# Patient Record
Sex: Male | Born: 1940 | Smoking: Former smoker
Health system: Southern US, Community
[De-identification: ages and names within clinical notes are randomized; demographics above are authoritative.]

## PROBLEM LIST (undated history)

## (undated) DIAGNOSIS — T4145XA Adverse effect of unspecified anesthetic, initial encounter: Secondary | ICD-10-CM

## (undated) DIAGNOSIS — R351 Nocturia: Secondary | ICD-10-CM

## (undated) DIAGNOSIS — N400 Enlarged prostate without lower urinary tract symptoms: Secondary | ICD-10-CM

## (undated) DIAGNOSIS — Z87898 Personal history of other specified conditions: Secondary | ICD-10-CM

## (undated) DIAGNOSIS — Z973 Presence of spectacles and contact lenses: Secondary | ICD-10-CM

## (undated) DIAGNOSIS — I10 Essential (primary) hypertension: Secondary | ICD-10-CM

## (undated) DIAGNOSIS — M1990 Unspecified osteoarthritis, unspecified site: Secondary | ICD-10-CM

## (undated) DIAGNOSIS — T8859XA Other complications of anesthesia, initial encounter: Secondary | ICD-10-CM

## (undated) DIAGNOSIS — R112 Nausea with vomiting, unspecified: Secondary | ICD-10-CM

## (undated) DIAGNOSIS — Z9889 Other specified postprocedural states: Secondary | ICD-10-CM

---

## 2012-05-12 DIAGNOSIS — R6889 Other general symptoms and signs: Secondary | ICD-10-CM | POA: Diagnosis not present

## 2012-08-02 DIAGNOSIS — J069 Acute upper respiratory infection, unspecified: Secondary | ICD-10-CM | POA: Diagnosis not present

## 2012-08-02 DIAGNOSIS — J111 Influenza due to unidentified influenza virus with other respiratory manifestations: Secondary | ICD-10-CM | POA: Diagnosis not present

## 2013-09-27 DIAGNOSIS — Z800 Family history of malignant neoplasm of digestive organs: Secondary | ICD-10-CM | POA: Diagnosis not present

## 2013-09-27 DIAGNOSIS — K5730 Diverticulosis of large intestine without perforation or abscess without bleeding: Secondary | ICD-10-CM | POA: Diagnosis not present

## 2013-09-27 DIAGNOSIS — Z1211 Encounter for screening for malignant neoplasm of colon: Secondary | ICD-10-CM | POA: Diagnosis not present

## 2013-09-27 DIAGNOSIS — K5900 Constipation, unspecified: Secondary | ICD-10-CM | POA: Diagnosis not present

## 2013-10-27 DIAGNOSIS — Z800 Family history of malignant neoplasm of digestive organs: Secondary | ICD-10-CM | POA: Diagnosis not present

## 2013-10-27 DIAGNOSIS — Z86010 Personal history of colonic polyps: Secondary | ICD-10-CM | POA: Diagnosis not present

## 2013-10-27 DIAGNOSIS — D126 Benign neoplasm of colon, unspecified: Secondary | ICD-10-CM | POA: Diagnosis not present

## 2013-10-27 DIAGNOSIS — Z87891 Personal history of nicotine dependence: Secondary | ICD-10-CM | POA: Diagnosis not present

## 2013-10-27 DIAGNOSIS — K5730 Diverticulosis of large intestine without perforation or abscess without bleeding: Secondary | ICD-10-CM | POA: Diagnosis not present

## 2013-10-27 DIAGNOSIS — Z7982 Long term (current) use of aspirin: Secondary | ICD-10-CM | POA: Diagnosis not present

## 2013-10-27 DIAGNOSIS — Z1211 Encounter for screening for malignant neoplasm of colon: Secondary | ICD-10-CM | POA: Diagnosis not present

## 2014-01-04 DIAGNOSIS — R5381 Other malaise: Secondary | ICD-10-CM | POA: Diagnosis not present

## 2014-01-04 DIAGNOSIS — D518 Other vitamin B12 deficiency anemias: Secondary | ICD-10-CM | POA: Diagnosis not present

## 2014-03-28 DIAGNOSIS — G8929 Other chronic pain: Secondary | ICD-10-CM | POA: Diagnosis not present

## 2014-03-28 DIAGNOSIS — M25561 Pain in right knee: Secondary | ICD-10-CM | POA: Diagnosis not present

## 2014-04-02 DIAGNOSIS — M25461 Effusion, right knee: Secondary | ICD-10-CM | POA: Diagnosis not present

## 2014-04-02 DIAGNOSIS — M25561 Pain in right knee: Secondary | ICD-10-CM | POA: Diagnosis not present

## 2014-04-02 DIAGNOSIS — M1711 Unilateral primary osteoarthritis, right knee: Secondary | ICD-10-CM | POA: Diagnosis not present

## 2014-04-05 DIAGNOSIS — Z79899 Other long term (current) drug therapy: Secondary | ICD-10-CM | POA: Diagnosis not present

## 2014-04-05 DIAGNOSIS — M1990 Unspecified osteoarthritis, unspecified site: Secondary | ICD-10-CM | POA: Diagnosis not present

## 2014-04-23 ENCOUNTER — Other Ambulatory Visit: Payer: Self-pay | Admitting: Orthopedic Surgery

## 2014-05-18 HISTORY — PX: CATARACT EXTRACTION W/ INTRAOCULAR LENS  IMPLANT, BILATERAL: SHX1307

## 2014-05-23 ENCOUNTER — Encounter (HOSPITAL_COMMUNITY)
Admission: RE | Admit: 2014-05-23 | Discharge: 2014-05-23 | Disposition: A | Discharge status: Home / Self Care | Payer: Medicare Other | Source: Ambulatory Visit | Attending: Orthopedic Surgery | Admitting: Orthopedic Surgery

## 2014-05-23 ENCOUNTER — Encounter (HOSPITAL_COMMUNITY): Payer: Self-pay

## 2014-05-23 DIAGNOSIS — R918 Other nonspecific abnormal finding of lung field: Secondary | ICD-10-CM | POA: Diagnosis not present

## 2014-05-23 DIAGNOSIS — Z87891 Personal history of nicotine dependence: Secondary | ICD-10-CM | POA: Insufficient documentation

## 2014-05-23 DIAGNOSIS — Z01812 Encounter for preprocedural laboratory examination: Secondary | ICD-10-CM | POA: Diagnosis not present

## 2014-05-23 DIAGNOSIS — Z7982 Long term (current) use of aspirin: Secondary | ICD-10-CM | POA: Diagnosis not present

## 2014-05-23 DIAGNOSIS — Z01818 Encounter for other preprocedural examination: Secondary | ICD-10-CM | POA: Diagnosis not present

## 2014-05-23 DIAGNOSIS — M1711 Unilateral primary osteoarthritis, right knee: Secondary | ICD-10-CM | POA: Insufficient documentation

## 2014-05-23 LAB — COMPREHENSIVE METABOLIC PANEL
ALK PHOS: 77 U/L (ref 39–117)
ALT: 16 U/L (ref 0–53)
AST: 27 U/L (ref 0–37)
Albumin: 4 g/dL (ref 3.5–5.2)
Anion gap: 8 (ref 5–15)
BUN: 14 mg/dL (ref 6–23)
CHLORIDE: 102 meq/L (ref 96–112)
CO2: 29 mmol/L (ref 19–32)
Calcium: 9.3 mg/dL (ref 8.4–10.5)
Creatinine, Ser: 0.96 mg/dL (ref 0.50–1.35)
GFR calc non Af Amer: 79 mL/min — ABNORMAL LOW (ref >90)
Glucose, Bld: 89 mg/dL (ref 70–99)
Potassium: 4.2 mmol/L (ref 3.5–5.1)
Sodium: 139 mmol/L (ref 135–145)
TOTAL PROTEIN: 7.8 g/dL (ref 6.0–8.3)
Total Bilirubin: 0.9 mg/dL (ref 0.3–1.2)

## 2014-05-23 LAB — CBC WITH DIFFERENTIAL/PLATELET
Basophils Absolute: 0 10*3/uL (ref 0.0–0.1)
Basophils Relative: 1 % (ref 0–1)
Eosinophils Absolute: 0.1 10*3/uL (ref 0.0–0.7)
Eosinophils Relative: 2 % (ref 0–5)
HCT: 44 % (ref 39.0–52.0)
HEMOGLOBIN: 14.4 g/dL (ref 13.0–17.0)
LYMPHS PCT: 29 % (ref 12–46)
Lymphs Abs: 1.3 10*3/uL (ref 0.7–4.0)
MCH: 29.2 pg (ref 26.0–34.0)
MCHC: 32.7 g/dL (ref 30.0–36.0)
MCV: 89.2 fL (ref 78.0–100.0)
Monocytes Absolute: 0.6 10*3/uL (ref 0.1–1.0)
Monocytes Relative: 12 % (ref 3–12)
NEUTROS PCT: 56 % (ref 43–77)
Neutro Abs: 2.5 10*3/uL (ref 1.7–7.7)
Platelets: 230 10*3/uL (ref 150–400)
RBC: 4.93 MIL/uL (ref 4.22–5.81)
RDW: 13.5 % (ref 11.5–15.5)
WBC: 4.5 10*3/uL (ref 4.0–10.5)

## 2014-05-23 LAB — PROTIME-INR
INR: 1.02 (ref 0.00–1.49)
Prothrombin Time: 13.5 seconds (ref 11.6–15.2)

## 2014-05-23 LAB — APTT: APTT: 31 s (ref 24–37)

## 2014-05-23 LAB — URINALYSIS, ROUTINE W REFLEX MICROSCOPIC
Bilirubin Urine: NEGATIVE
Glucose, UA: NEGATIVE mg/dL
Hgb urine dipstick: NEGATIVE
KETONES UR: NEGATIVE mg/dL
LEUKOCYTES UA: NEGATIVE
Nitrite: NEGATIVE
PROTEIN: NEGATIVE mg/dL
SPECIFIC GRAVITY, URINE: 1.02 (ref 1.005–1.030)
UROBILINOGEN UA: 1 mg/dL (ref 0.0–1.0)
pH: 7.5 (ref 5.0–8.0)

## 2014-05-23 LAB — SURGICAL PCR SCREEN
MRSA, PCR: NEGATIVE
STAPHYLOCOCCUS AUREUS: NEGATIVE

## 2014-05-23 NOTE — Pre-Procedure Instructions (Signed)
Adam [name]  05/23/2014   Your procedure is scheduled on:  06-04-2014    Report to [facility] Admitting at 9:00 AM  .  Call this number if you have problems the morning of surgery: [phone]   Remember:   Do not eat food or drink liquids after midnight.   Take these medicines the morning of surgery with A SIP OF WATER: none   Do not wear jewelry.  Do not wear lotions, powders, or perfumes. You may not wear deodorant.  Do not shave 48 hours prior to surgery. Men may shave face and neck.  Do not bring valuables to the hospital.  [facility] is not responsible  for any belongings or valuables.               Contacts, dentures or bridgework may not be worn into surgery.   Leave suitcase in the car. After surgery it may be brought to your room.  For patients admitted to the hospital, discharge time is determined by your  treatment team.               Patients discharged the day of surgery will not be allowed to drive  home.    Special Instructions: See attached sheet for instructions on CHG bath/shower   Please read over the following fact sheets that you were given: Pain Booklet, Coughing and Deep Breathing and Surgical Site Infection Prevention

## 2014-05-24 LAB — URINE CULTURE
Colony Count: NO GROWTH
Culture: NO GROWTH

## 2014-05-25 DIAGNOSIS — M1711 Unilateral primary osteoarthritis, right knee: Secondary | ICD-10-CM | POA: Diagnosis not present

## 2014-06-03 MED ORDER — CHLORHEXIDINE GLUCONATE 4 % EX LIQD
60.0000 mL | Freq: Once | CUTANEOUS | Status: DC
  Filled 2014-06-03: qty 60

## 2014-06-03 MED ORDER — BUPIVACAINE LIPOSOME 1.3 % IJ SUSP
20.0000 mL | Freq: Once | INTRAMUSCULAR | Status: DC
  Filled 2014-06-03: qty 20

## 2014-06-03 MED ORDER — CEFAZOLIN SODIUM-DEXTROSE 2-3 GM-% IV SOLR
2.0000 g | INTRAVENOUS | Status: DC
Start: 2014-06-03 — End: 2014-06-04

## 2014-06-03 MED ORDER — TRANEXAMIC ACID 100 MG/ML IV SOLN
1000.0000 mg | INTRAVENOUS | Status: DC
  Filled 2014-06-03: qty 10

## 2014-06-03 NOTE — Anesthesia Preprocedure Evaluation (Signed)
Anesthesia Evaluation  Patient identified by MRN, date of birth, ID band Patient awake    Reviewed: Allergy & Precautions, NPO status , Patient's Chart, lab work & pertinent test results, reviewed documented beta blocker date and time   Airway        Dental   Pulmonary former smoker,          Cardiovascular negative cardio ROS      Neuro/Psych negative neurological ROS     GI/Hepatic negative GI ROS, Neg liver ROS,   Endo/Other  negative endocrine ROS  Renal/GU negative Renal ROS     Musculoskeletal   Abdominal   Peds  Hematology negative hematology ROS (+)   Anesthesia Other Findings   Reproductive/Obstetrics                             Anesthesia Physical Anesthesia Plan  ASA: II  Anesthesia Plan: General   Post-op Pain Management: MAC Combined w/ Regional for Post-op pain   Induction: Intravenous  Airway Management Planned: LMA  Additional Equipment:   Intra-op Plan:   Post-operative Plan: Extubation in OR  Informed Consent: I have reviewed the patients History and Physical, chart, labs and discussed the procedure including the risks, benefits and alternatives for the proposed anesthesia with the patient or authorized representative who has indicated his/her understanding and acceptance.     Plan Discussed with:   Anesthesia Plan Comments: (Check am EKG)        Anesthesia Quick Evaluation

## 2014-06-04 ENCOUNTER — Encounter (HOSPITAL_COMMUNITY): Payer: Self-pay | Admitting: Surgery

## 2014-06-04 ENCOUNTER — Inpatient Hospital Stay (HOSPITAL_COMMUNITY): Payer: Medicare Other | Admitting: Anesthesiology

## 2014-06-04 ENCOUNTER — Inpatient Hospital Stay (HOSPITAL_COMMUNITY)
Admission: RE | Admit: 2014-06-04 | Discharge: 2014-06-07 | DRG: 470 | Disposition: A | Discharge status: Home / Self Care | Payer: Medicare Other | Source: Ambulatory Visit | Attending: Orthopedic Surgery | Admitting: Orthopedic Surgery

## 2014-06-04 ENCOUNTER — Encounter (HOSPITAL_COMMUNITY)
Admission: RE | Disposition: A | Discharge status: Home / Self Care | Payer: Self-pay | Source: Ambulatory Visit | Attending: Orthopedic Surgery

## 2014-06-04 DIAGNOSIS — M25561 Pain in right knee: Secondary | ICD-10-CM | POA: Diagnosis not present

## 2014-06-04 DIAGNOSIS — G8918 Other acute postprocedural pain: Secondary | ICD-10-CM | POA: Diagnosis not present

## 2014-06-04 DIAGNOSIS — D62 Acute posthemorrhagic anemia: Secondary | ICD-10-CM | POA: Diagnosis not present

## 2014-06-04 DIAGNOSIS — Z87891 Personal history of nicotine dependence: Secondary | ICD-10-CM

## 2014-06-04 DIAGNOSIS — M1711 Unilateral primary osteoarthritis, right knee: Principal | ICD-10-CM | POA: Diagnosis present

## 2014-06-04 DIAGNOSIS — Z96659 Presence of unspecified artificial knee joint: Secondary | ICD-10-CM

## 2014-06-04 DIAGNOSIS — M179 Osteoarthritis of knee, unspecified: Secondary | ICD-10-CM | POA: Diagnosis not present

## 2014-06-04 HISTORY — DX: Other complications of anesthesia, initial encounter: T88.59XA

## 2014-06-04 HISTORY — DX: Nausea with vomiting, unspecified: R11.2

## 2014-06-04 HISTORY — DX: Other specified postprocedural states: Z98.890

## 2014-06-04 HISTORY — PX: TOTAL KNEE ARTHROPLASTY: SHX125

## 2014-06-04 HISTORY — DX: Adverse effect of unspecified anesthetic, initial encounter: T41.45XA

## 2014-06-04 LAB — CBC
HCT: 37.8 % — ABNORMAL LOW (ref 39.0–52.0)
HEMOGLOBIN: 12.5 g/dL — AB (ref 13.0–17.0)
MCH: 29.3 pg (ref 26.0–34.0)
MCHC: 33.1 g/dL (ref 30.0–36.0)
MCV: 88.7 fL (ref 78.0–100.0)
Platelets: 211 10*3/uL (ref 150–400)
RBC: 4.26 MIL/uL (ref 4.22–5.81)
RDW: 13.5 % (ref 11.5–15.5)
WBC: 7.2 10*3/uL (ref 4.0–10.5)

## 2014-06-04 LAB — CREATININE, SERUM
Creatinine, Ser: 0.92 mg/dL (ref 0.50–1.35)
GFR, EST NON AFRICAN AMERICAN: 80 mL/min — AB (ref >90)

## 2014-06-04 SURGERY — ARTHROPLASTY, KNEE, TOTAL
Anesthesia: General | Laterality: Right

## 2014-06-04 MED ORDER — CELECOXIB 200 MG PO CAPS
200.0000 mg | ORAL_CAPSULE | Freq: Two times a day (BID) | ORAL | Status: DC
  Administered 2014-06-04 – 2014-06-07 (×6): 200 mg via ORAL
  Filled 2014-06-04 (×8): qty 1

## 2014-06-04 MED ORDER — FENTANYL CITRATE 0.05 MG/ML IJ SOLN
50.0000 ug | Freq: Once | INTRAMUSCULAR | Status: AC
  Administered 2014-06-04: 50 ug via INTRAVENOUS

## 2014-06-04 MED ORDER — GLYCOPYRROLATE 0.2 MG/ML IJ SOLN
INTRAMUSCULAR | Status: AC
  Filled 2014-06-04: qty 2

## 2014-06-04 MED ORDER — SUCCINYLCHOLINE CHLORIDE 20 MG/ML IJ SOLN
INTRAMUSCULAR | Status: AC
  Filled 2014-06-04: qty 1

## 2014-06-04 MED ORDER — NEOSTIGMINE METHYLSULFATE 10 MG/10ML IV SOLN
INTRAVENOUS | Status: DC | PRN
  Administered 2014-06-04: 3 mg via INTRAVENOUS

## 2014-06-04 MED ORDER — ZOLPIDEM TARTRATE 5 MG PO TABS: 5.0000 mg | ORAL_TABLET | Freq: Every evening | ORAL | Status: DC | PRN

## 2014-06-04 MED ORDER — LACTATED RINGERS IV SOLN
INTRAVENOUS | Status: DC
  Administered 2014-06-04 (×2): via INTRAVENOUS

## 2014-06-04 MED ORDER — PHENOL 1.4 % MT LIQD: 1.0000 | OROMUCOSAL | Status: DC | PRN

## 2014-06-04 MED ORDER — ONDANSETRON HCL 4 MG/2ML IJ SOLN
INTRAMUSCULAR | Status: AC
  Filled 2014-06-04: qty 2

## 2014-06-04 MED ORDER — FENTANYL CITRATE 0.05 MG/ML IJ SOLN
INTRAMUSCULAR | Status: AC
  Filled 2014-06-04: qty 5

## 2014-06-04 MED ORDER — SENNOSIDES-DOCUSATE SODIUM 8.6-50 MG PO TABS: 1.0000 | ORAL_TABLET | Freq: Every evening | ORAL | Status: DC | PRN

## 2014-06-04 MED ORDER — OXYCODONE HCL ER 10 MG PO T12A
10.0000 mg | EXTENDED_RELEASE_TABLET | Freq: Two times a day (BID) | ORAL | Status: DC
  Administered 2014-06-04 – 2014-06-07 (×6): 10 mg via ORAL
  Filled 2014-06-04 (×6): qty 1

## 2014-06-04 MED ORDER — ONDANSETRON HCL 4 MG/2ML IJ SOLN
4.0000 mg | Freq: Four times a day (QID) | INTRAMUSCULAR | Status: DC | PRN
  Administered 2014-06-04: 4 mg via INTRAVENOUS
  Filled 2014-06-04: qty 2

## 2014-06-04 MED ORDER — METOCLOPRAMIDE HCL 10 MG PO TABS
5.0000 mg | ORAL_TABLET | Freq: Three times a day (TID) | ORAL | Status: DC | PRN
Start: 2014-06-04 — End: 2014-06-07

## 2014-06-04 MED ORDER — ONDANSETRON HCL 4 MG PO TABS
4.0000 mg | ORAL_TABLET | Freq: Four times a day (QID) | ORAL | Status: DC | PRN
  Administered 2014-06-05 (×2): 4 mg via ORAL
  Filled 2014-06-04 (×2): qty 1

## 2014-06-04 MED ORDER — METHOCARBAMOL 1000 MG/10ML IJ SOLN: 500.0000 mg | Freq: Four times a day (QID) | INTRAVENOUS | Status: DC | PRN

## 2014-06-04 MED ORDER — PROPOFOL 10 MG/ML IV BOLUS
INTRAVENOUS | Status: DC | PRN
  Administered 2014-06-04: 200 mg via INTRAVENOUS

## 2014-06-04 MED ORDER — METHOCARBAMOL 500 MG PO TABS
500.0000 mg | ORAL_TABLET | Freq: Four times a day (QID) | ORAL | Status: DC | PRN
Start: 2014-06-04 — End: 2014-06-07
  Administered 2014-06-05 – 2014-06-07 (×3): 500 mg via ORAL
  Filled 2014-06-04 (×3): qty 1

## 2014-06-04 MED ORDER — ROCURONIUM BROMIDE 100 MG/10ML IV SOLN
INTRAVENOUS | Status: DC | PRN
  Administered 2014-06-04: 40 mg via INTRAVENOUS

## 2014-06-04 MED ORDER — GLYCOPYRROLATE 0.2 MG/ML IJ SOLN
INTRAMUSCULAR | Status: DC | PRN
  Administered 2014-06-04: 0.4 mg via INTRAVENOUS

## 2014-06-04 MED ORDER — LACTATED RINGERS IV SOLN
INTRAVENOUS | Status: DC | PRN
  Administered 2014-06-04 (×2): via INTRAVENOUS

## 2014-06-04 MED ORDER — ALUM & MAG HYDROXIDE-SIMETH 200-200-20 MG/5ML PO SUSP: 30.0000 mL | ORAL | Status: DC | PRN

## 2014-06-04 MED ORDER — FENTANYL CITRATE 0.05 MG/ML IJ SOLN: 25.0000 ug | INTRAMUSCULAR | Status: DC | PRN

## 2014-06-04 MED ORDER — FENTANYL CITRATE 0.05 MG/ML IJ SOLN
INTRAMUSCULAR | Status: AC
  Filled 2014-06-04: qty 2

## 2014-06-04 MED ORDER — BISACODYL 5 MG PO TBEC: 5.0000 mg | DELAYED_RELEASE_TABLET | Freq: Every day | ORAL | Status: DC | PRN

## 2014-06-04 MED ORDER — MEPERIDINE HCL 25 MG/ML IJ SOLN: 6.2500 mg | INTRAMUSCULAR | Status: DC | PRN

## 2014-06-04 MED ORDER — FLEET ENEMA 7-19 GM/118ML RE ENEM: 1.0000 | ENEMA | Freq: Once | RECTAL | Status: AC | PRN

## 2014-06-04 MED ORDER — LIDOCAINE HCL (CARDIAC) 20 MG/ML IV SOLN
INTRAVENOUS | Status: AC
  Filled 2014-06-04: qty 5

## 2014-06-04 MED ORDER — MENTHOL 3 MG MT LOZG: 1.0000 | LOZENGE | OROMUCOSAL | Status: DC | PRN

## 2014-06-04 MED ORDER — ACETAMINOPHEN 325 MG PO TABS: 650.0000 mg | ORAL_TABLET | Freq: Four times a day (QID) | ORAL | Status: DC | PRN

## 2014-06-04 MED ORDER — SODIUM CHLORIDE 0.9 % IV SOLN: INTRAVENOUS | Status: DC

## 2014-06-04 MED ORDER — PROMETHAZINE HCL 25 MG/ML IJ SOLN: 6.2500 mg | INTRAMUSCULAR | Status: DC | PRN

## 2014-06-04 MED ORDER — CEFAZOLIN SODIUM-DEXTROSE 2-3 GM-% IV SOLR
2.0000 g | Freq: Four times a day (QID) | INTRAVENOUS | Status: AC
  Administered 2014-06-04 (×2): 2 g via INTRAVENOUS
  Filled 2014-06-04 (×3): qty 50

## 2014-06-04 MED ORDER — ACETAMINOPHEN 650 MG RE SUPP: 650.0000 mg | Freq: Four times a day (QID) | RECTAL | Status: DC | PRN

## 2014-06-04 MED ORDER — BUPIVACAINE-EPINEPHRINE (PF) 0.5% -1:200000 IJ SOLN
INTRAMUSCULAR | Status: DC | PRN
  Administered 2014-06-04: 30 mL via PERINEURAL

## 2014-06-04 MED ORDER — MIDAZOLAM HCL 2 MG/2ML IJ SOLN
INTRAMUSCULAR | Status: AC
  Administered 2014-06-04: 1 mg
  Filled 2014-06-04: qty 2

## 2014-06-04 MED ORDER — MIDAZOLAM HCL 5 MG/ML IJ SOLN: 1.0000 mg | Freq: Once | INTRAMUSCULAR | Status: DC

## 2014-06-04 MED ORDER — PHENYLEPHRINE HCL 10 MG/ML IJ SOLN
INTRAMUSCULAR | Status: DC | PRN
  Administered 2014-06-04 (×2): 120 ug via INTRAVENOUS
  Administered 2014-06-04 (×2): 80 ug via INTRAVENOUS

## 2014-06-04 MED ORDER — NEOSTIGMINE METHYLSULFATE 10 MG/10ML IV SOLN
INTRAVENOUS | Status: AC
  Filled 2014-06-04: qty 1

## 2014-06-04 MED ORDER — OXYCODONE HCL 5 MG PO TABS
5.0000 mg | ORAL_TABLET | ORAL | Status: DC | PRN
  Administered 2014-06-04 – 2014-06-07 (×14): 10 mg via ORAL
  Filled 2014-06-04 (×14): qty 2

## 2014-06-04 MED ORDER — SODIUM CHLORIDE 0.9 % IV SOLN
INTRAVENOUS | Status: DC
  Administered 2014-06-04 – 2014-06-05 (×2): via INTRAVENOUS

## 2014-06-04 MED ORDER — BUPIVACAINE LIPOSOME 1.3 % IJ SUSP
INTRAMUSCULAR | Status: DC | PRN
  Administered 2014-06-04: 20 mL

## 2014-06-04 MED ORDER — HYDROMORPHONE HCL 1 MG/ML IJ SOLN
1.0000 mg | INTRAMUSCULAR | Status: DC | PRN
  Administered 2014-06-04: 1 mg via INTRAVENOUS
  Filled 2014-06-04: qty 1

## 2014-06-04 MED ORDER — BUPIVACAINE-EPINEPHRINE (PF) 0.5% -1:200000 IJ SOLN
INTRAMUSCULAR | Status: AC
  Filled 2014-06-04: qty 30

## 2014-06-04 MED ORDER — ENOXAPARIN SODIUM 30 MG/0.3ML ~~LOC~~ SOLN
30.0000 mg | Freq: Two times a day (BID) | SUBCUTANEOUS | Status: DC
  Administered 2014-06-05 – 2014-06-07 (×5): 30 mg via SUBCUTANEOUS
  Filled 2014-06-04 (×7): qty 0.3

## 2014-06-04 MED ORDER — CEFAZOLIN SODIUM-DEXTROSE 2-3 GM-% IV SOLR
INTRAVENOUS | Status: AC
  Administered 2014-06-04: 2 g via INTRAVENOUS
  Filled 2014-06-04: qty 50

## 2014-06-04 MED ORDER — DIPHENHYDRAMINE HCL 12.5 MG/5ML PO ELIX: 12.5000 mg | ORAL_SOLUTION | ORAL | Status: DC | PRN

## 2014-06-04 MED ORDER — TRANEXAMIC ACID 100 MG/ML IV SOLN
1000.0000 mg | INTRAVENOUS | Status: DC | PRN
  Administered 2014-06-04: 1000 mg via INTRAVENOUS

## 2014-06-04 MED ORDER — BUPIVACAINE-EPINEPHRINE 0.5% -1:200000 IJ SOLN
INTRAMUSCULAR | Status: DC | PRN
  Administered 2014-06-04: 30 mL

## 2014-06-04 MED ORDER — FENTANYL CITRATE 0.05 MG/ML IJ SOLN
INTRAMUSCULAR | Status: DC | PRN
Start: 2014-06-04 — End: 2014-06-04
  Administered 2014-06-04 (×2): 50 ug via INTRAVENOUS
  Administered 2014-06-04: 100 ug via INTRAVENOUS
  Administered 2014-06-04 (×5): 50 ug via INTRAVENOUS

## 2014-06-04 MED ORDER — ONDANSETRON HCL 4 MG/2ML IJ SOLN
INTRAMUSCULAR | Status: DC | PRN
  Administered 2014-06-04: 4 mg via INTRAVENOUS

## 2014-06-04 MED ORDER — METOCLOPRAMIDE HCL 5 MG/ML IJ SOLN: 5.0000 mg | Freq: Three times a day (TID) | INTRAMUSCULAR | Status: DC | PRN

## 2014-06-04 MED ORDER — DOCUSATE SODIUM 100 MG PO CAPS
100.0000 mg | ORAL_CAPSULE | Freq: Two times a day (BID) | ORAL | Status: DC
  Administered 2014-06-05 – 2014-06-07 (×5): 100 mg via ORAL
  Filled 2014-06-04 (×6): qty 1

## 2014-06-04 MED ORDER — EPHEDRINE SULFATE 50 MG/ML IJ SOLN
INTRAMUSCULAR | Status: DC | PRN
  Administered 2014-06-04: 15 mg via INTRAVENOUS
  Administered 2014-06-04: 10 mg via INTRAVENOUS

## 2014-06-04 SURGICAL SUPPLY — 60 items
BANDAGE ESMARK 6X9 LF (GAUZE/BANDAGES/DRESSINGS) ×1 IMPLANT
BLADE SAGITTAL 13X1.27X60 (BLADE) ×2 IMPLANT
BLADE SAGITTAL 13X1.27X60MM (BLADE) ×1
BLADE SAW SGTL 83.5X18.5 (BLADE) ×3 IMPLANT
BLADE SURG 10 STRL SS (BLADE) ×3 IMPLANT
BNDG ESMARK 6X9 LF (GAUZE/BANDAGES/DRESSINGS) ×3
BOWL SMART MIX CTS (DISPOSABLE) ×3 IMPLANT
CAPT KNEE TOTAL 3 ×3 IMPLANT
CEMENT BONE SIMPLEX SPEEDSET (Cement) ×6 IMPLANT
COVER SURGICAL LIGHT HANDLE (MISCELLANEOUS) ×3 IMPLANT
CUFF TOURNIQUET SINGLE 34IN LL (TOURNIQUET CUFF) ×3 IMPLANT
DRAPE EXTREMITY T 121X128X90 (DRAPE) ×3 IMPLANT
DRAPE IMP U-DRAPE 54X76 (DRAPES) ×3 IMPLANT
DRAPE INCISE IOBAN 66X45 STRL (DRAPES) ×6 IMPLANT
DRAPE PROXIMA HALF (DRAPES) ×3 IMPLANT
DRAPE U-SHAPE 47X51 STRL (DRAPES) ×3 IMPLANT
DRSG ADAPTIC 3X8 NADH LF (GAUZE/BANDAGES/DRESSINGS) ×3 IMPLANT
DRSG PAD ABDOMINAL 8X10 ST (GAUZE/BANDAGES/DRESSINGS) ×3 IMPLANT
DURAPREP 26ML APPLICATOR (WOUND CARE) ×6 IMPLANT
ELECT REM PT RETURN 9FT ADLT (ELECTROSURGICAL) ×3
ELECTRODE REM PT RTRN 9FT ADLT (ELECTROSURGICAL) ×1 IMPLANT
EVACUATOR 1/8 PVC DRAIN (DRAIN) ×3 IMPLANT
GAUZE SPONGE 4X4 12PLY STRL (GAUZE/BANDAGES/DRESSINGS) ×3 IMPLANT
GLOVE BIOGEL M 7.0 STRL (GLOVE) IMPLANT
GLOVE BIOGEL PI IND STRL 7.5 (GLOVE) IMPLANT
GLOVE BIOGEL PI IND STRL 8.5 (GLOVE) ×2 IMPLANT
GLOVE BIOGEL PI INDICATOR 7.5 (GLOVE)
GLOVE BIOGEL PI INDICATOR 8.5 (GLOVE) ×4
GLOVE SURG ORTHO 8.0 STRL STRW (GLOVE) ×6 IMPLANT
GOWN STRL REUS W/ TWL LRG LVL3 (GOWN DISPOSABLE) ×1 IMPLANT
GOWN STRL REUS W/ TWL XL LVL3 (GOWN DISPOSABLE) ×2 IMPLANT
GOWN STRL REUS W/TWL LRG LVL3 (GOWN DISPOSABLE) ×2
GOWN STRL REUS W/TWL XL LVL3 (GOWN DISPOSABLE) ×4
HANDPIECE INTERPULSE COAX TIP (DISPOSABLE) ×2
HOOD PEEL AWAY FACE SHEILD DIS (HOOD) ×9 IMPLANT
KIT BASIN OR (CUSTOM PROCEDURE TRAY) ×3 IMPLANT
KIT ROOM TURNOVER OR (KITS) ×3 IMPLANT
KNEE CAPITATED TOTAL 3 ×1 IMPLANT
MANIFOLD NEPTUNE II (INSTRUMENTS) ×3 IMPLANT
NEEDLE 22X1 1/2 (OR ONLY) (NEEDLE) ×6 IMPLANT
NS IRRIG 1000ML POUR BTL (IV SOLUTION) ×3 IMPLANT
PACK TOTAL JOINT (CUSTOM PROCEDURE TRAY) ×3 IMPLANT
PACK UNIVERSAL I (CUSTOM PROCEDURE TRAY) ×3 IMPLANT
PAD ABD 8X10 STRL (GAUZE/BANDAGES/DRESSINGS) ×3 IMPLANT
PAD ARMBOARD 7.5X6 YLW CONV (MISCELLANEOUS) ×6 IMPLANT
PADDING CAST COTTON 6X4 STRL (CAST SUPPLIES) ×3 IMPLANT
SET HNDPC FAN SPRY TIP SCT (DISPOSABLE) ×1 IMPLANT
SPONGE GAUZE 4X4 12PLY STER LF (GAUZE/BANDAGES/DRESSINGS) ×3 IMPLANT
STAPLER VISISTAT 35W (STAPLE) ×3 IMPLANT
SUCTION FRAZIER TIP 10 FR DISP (SUCTIONS) ×3 IMPLANT
SUT BONE WAX W31G (SUTURE) ×3 IMPLANT
SUT VIC AB 0 CTB1 27 (SUTURE) ×6 IMPLANT
SUT VIC AB 1 CT1 27 (SUTURE) ×4
SUT VIC AB 1 CT1 27XBRD ANBCTR (SUTURE) ×2 IMPLANT
SUT VIC AB 2-0 CT1 27 (SUTURE) ×4
SUT VIC AB 2-0 CT1 TAPERPNT 27 (SUTURE) ×2 IMPLANT
SYR 20CC LL (SYRINGE) ×6 IMPLANT
TOWEL OR 17X24 6PK STRL BLUE (TOWEL DISPOSABLE) ×3 IMPLANT
TOWEL OR 17X26 10 PK STRL BLUE (TOWEL DISPOSABLE) ×3 IMPLANT
WATER STERILE IRR 1000ML POUR (IV SOLUTION) ×6 IMPLANT

## 2014-06-04 NOTE — Progress Notes (Signed)
Physical Therapy Evaluation Patient Details Name: Adam [name] MRN: [id number] DOB: 01-28-1941 Today's Date: 06/04/2014   History of Present Illness  Patient is a 74 yo male admitted 06/04/14 now s/p Rt TKA.  PMH:  Arthritis  Clinical Impression  Patient presents with problems listed below.  Will benefit from acute PT to maximize independence prior to discharge home with wife.  Limited today due to lightheadedness.  Should progress well with PT.    Follow Up Recommendations Home health PT;Supervision/Assistance - 24 hour    Equipment Recommendations  None recommended by PT    Recommendations for Other Services       Precautions / Restrictions Precautions Precautions: Knee Precaution Booklet Issued: Yes (comment) Precaution Comments: Reviewed precautions with patient and wife. Restrictions Weight Bearing Restrictions: Yes RLE Weight Bearing: Weight bearing as tolerated      Mobility  Bed Mobility Overal bed mobility: Needs Assistance Bed Mobility: Supine to Sit     Supine to sit: Min guard     General bed mobility comments: Removed CPM from RLE.  Verbal cues for technique.  Patient able to move RLE off of bed and use UE's to move to sitting position.  Once sitting, patient with good balance.  Transfers Overall transfer level: Needs assistance Equipment used: Rolling walker (2 wheeled) Transfers: Sit to/from [facility] Sit to Stand: Min assist Stand pivot transfers: Min assist       General transfer comment: Verbal cues for hand placement and technique.  Assist to rise to standing and for balance.  Patient able to take several steps to pivot to chair. Patient became lightheaded.  Sat in chair.  Improved in 2-3 minutes.  Ambulation/Gait                Stairs            Wheelchair Mobility    Modified Rankin (Stroke Patients Only)       Balance                                             Pertinent  Vitals/Pain Pain Assessment: 0-10 Pain Score: 3  Pain Location: Rt knee Pain Descriptors / Indicators: Aching;Sore Pain Intervention(s): Repositioned;Premedicated before session    [place] expects to be discharged to:: Private residence Living Arrangements: Spouse/significant other Available Help at Discharge: Family;Available 24 hours/day Type of Home: House Home Access: Ramped entrance     Home Layout: One level Home Equipment: Walker - 2 wheels;Bedside commode      Prior Function Level of Independence: Independent               Hand Dominance        Extremity/Trunk Assessment   Upper Extremity Assessment: Overall WFL for tasks assessed           Lower Extremity Assessment: RLE deficits/detail RLE Deficits / Details: Decreased strength and ROM post-op.  Patient able to assist with moving RLE off of bed.    Cervical / Trunk Assessment: Normal  Communication   Communication: No difficulties  Cognition Arousal/Alertness: Awake/alert Behavior During Therapy: WFL for tasks assessed/performed Overall Cognitive Status: Within Functional Limits for tasks assessed                      General Comments      Exercises Total Joint Exercises Ankle Circles/Pumps: AROM;Both;10  reps;Seated      Assessment/Plan    PT Assessment Patient needs continued PT services  PT Diagnosis Difficulty walking;Acute pain   PT Problem List Decreased strength;Decreased range of motion;Decreased activity tolerance;Decreased balance;Decreased mobility;Decreased knowledge of use of DME;Decreased knowledge of precautions;Pain  PT Treatment Interventions DME instruction;Gait training;Functional mobility training;Therapeutic activities;Therapeutic exercise;Patient/family education   PT Goals (Current goals can be found in the Care Plan section) Acute Rehab PT Goals Patient Stated Goal: To go home tomorrow PT Goal Formulation: With patient/family Time For  Goal Achievement: 06/11/14 Potential to Achieve Goals: Good    Frequency 7X/week   Barriers to discharge        Co-evaluation               End of Session Equipment Utilized During Treatment: Gait belt;Oxygen Activity Tolerance: Patient limited by pain (Limited by lightheadedness) Patient left: in chair;with call bell/phone within reach;with family/visitor present Nurse Communication: Mobility status         Time: 4827-0786 PT Time Calculation (min) (ACUTE ONLY): 22 min   Charges:   PT Evaluation $Initial PT Evaluation Tier I: 1 Procedure PT Treatments $Therapeutic Activity: 8-22 mins   PT G Codes:        Despina Pole 06/16/2014, 7:18 PM Carita Pian. Sanjuana Kava, [place] Pager [phone]

## 2014-06-04 NOTE — Progress Notes (Signed)
Utilization review completed.  

## 2014-06-04 NOTE — Progress Notes (Signed)
Orthopedic Tech Progress Note Patient Details:  Adam [name] Aug 13, 1940 [id number]  CPM Right Knee CPM Right Knee: On Right Knee Flexion (Degrees): 90 Right Knee Extension (Degrees): 0 Additional Comments: trapeze bar patient helper Viewed order from doctor's order list  Hildred Priest 06/04/2014, 2:24 PM

## 2014-06-04 NOTE — Transfer of Care (Signed)
Immediate Anesthesia Transfer of Care Note  Patient: Adam [name]  Procedure(s) Performed: Procedure(s): TOTAL KNEE ARTHROPLASTY (Right)  Patient Location: PACU  Anesthesia Type:GA combined with regional for post-op pain  Level of Consciousness: awake, alert , oriented and patient cooperative  Airway & Oxygen Therapy: Patient Spontanous Breathing and Patient connected to nasal cannula oxygen  Post-op Assessment: Report given to PACU RN and Post -op Vital signs reviewed and stable  Post vital signs: Reviewed and stable  Complications: No apparent anesthesia complications

## 2014-06-04 NOTE — Anesthesia Postprocedure Evaluation (Signed)
  Anesthesia Post-op Note  Patient: Adam [name]  Procedure(s) Performed: Procedure(s): TOTAL KNEE ARTHROPLASTY (Right)  Patient Location: PACU  Anesthesia Type:General  Level of Consciousness: awake, alert  and oriented  Airway and Oxygen Therapy: Patient Spontanous Breathing  Post-op Pain: moderate  Post-op Assessment: Post-op Vital signs reviewed and Patient's Cardiovascular Status Stable  Post-op Vital Signs: Reviewed and stable  Last Vitals:  Filed Vitals:   06/04/14 1447  BP: 149/86  Pulse: 76  Temp: 36.3 C  Resp: 18    Complications: No apparent anesthesia complications

## 2014-06-04 NOTE — Progress Notes (Signed)
Orthopedic Tech Progress Note Patient Details:  AUGUSTA HILBERT 1940-06-06 [id number] On cpm at 7:30 pm  Patient ID: SAVA PROBY, male   DOB: Oct 06, 1940, 74 y.o.   MRN: [id number]   Braulio Bosch 06/04/2014, 7:32 PM

## 2014-06-04 NOTE — Anesthesia Procedure Notes (Addendum)
Anesthesia Regional Block:  Adductor canal block  Pre-Anesthetic Checklist: ,, timeout performed, Correct Patient, Correct Site, Correct Laterality, Correct Procedure, Correct Position, site marked, Risks and benefits discussed,  Surgical consent,  Pre-op evaluation,  At surgeon's request and post-op pain management  Laterality: Lower and Right  Prep: Maximum Sterile Barrier Precautions used and chloraprep       Needles:  Injection technique: Single-shot     Needle Length: 10cm 10 cm Needle Gauge: 21 and 21 G    Additional Needles:  Procedures: ultrasound guided (picture in chart) Adductor canal block Narrative:  Start time: 06/04/2014 10:34 AM End time: 06/04/2014 10:45 AM  Performed by: Personally  Anesthesiologist: Alexis Frock  Additional Notes: R AC block, 44ml .5% marcaine with epi, US guided, talked to patient thorough procedure, multiple asp, no complications   Procedure Name: Intubation Date/Time: 06/04/2014 11:29 AM Performed by: Julian Reil Pre-anesthesia Checklist: Patient identified, Emergency Drugs available, Suction available and Patient being monitored Patient Re-evaluated:Patient Re-evaluated prior to inductionOxygen Delivery Method: Circle system utilized Preoxygenation: Pre-oxygenation with 100% oxygen Intubation Type: IV induction Ventilation: Mask ventilation without difficulty Laryngoscope Size: Mac and 4 Grade View: Grade I Tube type: Oral Tube size: 7.5 mm Number of attempts: 1 Airway Equipment and Method: Stylet Placement Confirmation: ETT inserted through vocal cords under direct vision,  positive ETCO2 and breath sounds checked- equal and bilateral Secured at: 22 cm Tube secured with: Tape Dental Injury: Teeth and Oropharynx as per pre-operative assessment  Comments: Attempted to place #5 LMA x 2 attempts.  Could not obtain seal.  Resumed bag/mask ventilation without difficulty. DL x 1 with MAC 4. Atraumatic oral intubation.

## 2014-06-04 NOTE — Op Note (Signed)
TOTAL KNEE REPLACEMENT OPERATIVE NOTE:  06/04/2014  8:08 PM  PATIENT:  Adam [name]  74 y.o. male  PRE-OPERATIVE DIAGNOSIS:  primary osteoarthritis right knee  POST-OPERATIVE DIAGNOSIS:  primary osteoarthritis right knee  PROCEDURE:  Procedure(s): TOTAL KNEE ARTHROPLASTY  SURGEON:  Surgeon(s): Vickey Huger, MD  PHYSICIAN ASSISTANT: Carlynn Spry, [facility]  ANESTHESIA:   general  DRAINS: Hemovac  SPECIMEN: None  COUNTS:  Correct  TOURNIQUET:   Total Tourniquet Time Documented: Thigh (Right) - 45 minutes Total: Thigh (Right) - 45 minutes   DICTATION:  Indication for procedure:    The patient is a 74 y.o. male who has failed conservative treatment for primary osteoarthritis right knee.  Informed consent was obtained prior to anesthesia. The risks versus benefits of the operation were explain and in a way the patient can, and did, understand.   On the implant demand matching protocol, this patient scored 10.  Therefore, this patient was not receive a polyethylene insert with vitamin E which is a high demand implant.  Description of procedure:     The patient was taken to the operating room and placed under anesthesia.  The patient was positioned in the usual fashion taking care that all body parts were adequately padded and/or protected.  I foley catheter was not placed.  A tourniquet was applied and the leg prepped and draped in the usual sterile fashion.  The extremity was exsanguinated with the esmarch and tourniquet inflated to 350 mmHg.  Pre-operative range of motion was normal.  The knee was in 6 degree of significant varus.  A midline incision approximately 6-7 inches long was made with a #10 blade.  A new blade was used to make a parapatellar arthrotomy going 2-3 cm into the quadriceps tendon, over the patella, and alongside the medial aspect of the patellar tendon.  A synovectomy was then performed with the #10 blade and forceps. I then elevated the deep MCL off the  medial tibial metaphysis subperiosteally around to the semimembranosus attachment.    I everted the patella and used calipers to measure patellar thickness.  I used the reamer to ream down to appropriate thickness to recreate the native thickness.  I then removed excess bone with the rongeur and sagittal saw.  I used the appropriately sized template and drilled the three lug holes.  I then put the trial in place and measured the thickness with the calipers to ensure recreation of the native thickness.  The trial was then removed and the patella subluxed and the knee brought into flexion.  A homan retractor was place to retract and protect the patella and lateral structures.  A Z-retractor was place medially to protect the medial structures.  The extra-medullary alignment system was used to make cut the tibial articular surface perpendicular to the anamotic axis of the tibia and in 3 degrees of posterior slope.  The cut surface and alignment jig was removed.  I then used the intramedullary alignment guide to make a 6 valgus cut on the distal femur.  I then marked out the epicondylar axis on the distal femur.  The posterior condylar axis measured 3 degrees.  I then used the anterior referencing sizer and measured the femur to be a size 12.  The 4-In-1 cutting block was screwed into place in external rotation matching the posterior condylar angle, making our cuts perpendicular to the epicondylar axis.  Anterior, posterior and chamfer cuts were made with the sagittal saw.  The cutting block and cut pieces were  removed.  A lamina spreader was placed in 90 degrees of flexion.  The ACL, PCL, menisci, and posterior condylar osteophytes were removed.  A 12 mm spacer blocked was found to offer good flexion and extension gap balance after severe in degree releasing.   The scoop retractor was then placed and the femoral finishing block was pinned in place.  The small sagittal saw was used as well as the lug drill to  finish the femur.  The block and cut surfaces were removed and the medullary canal hole filled with autograft bone from the cut pieces.  The tibia was delivered forward in deep flexion and external rotation.  A size G tray was selected and pinned into place centered on the medial 1/3 of the tibial tubercle.  The reamer and keel was used to prepare the tibia through the tray.    I then trialed with the size 12 femur, size G tibia, a 12 mm insert and the 35 patella.  I had excellent flexion/extension gap balance, excellent patella tracking.  Flexion was full and beyond 120 degrees; extension was zero.  These components were chosen and the staff opened them to me on the back table while the knee was lavaged copiously and the cement mixed.  The soft tissue was infiltrated with 60cc of exparel 1.3% through a 21 gauge needle.  I cemented in the components and removed all excess cement.  The polyethylene tibial component was snapped into place and the knee placed in extension while cement was hardening.  The capsule was infilltrated with 30cc of .25% Marcaine with epinephrine.  A hemovac was place in the joint exiting superolaterally.  A pain pump was place superomedially superficial to the arthrotomy.  Once the cement was hard, the tourniquet was let down.  Hemostasis was obtained.  The arthrotomy was closed with figure-8 #1 vicryl sutures.  The deep soft tissues were closed with #0 vicryls and the subcuticular layer closed with a running #2-0 vicryl.  The skin was reapproximated and closed with skin staples.  The wound was dressed with xeroform, 4 x4's, 2 ABD sponges, a single layer of webril and a TED stocking.   The patient was then awakened, extubated, and taken to the recovery room in stable condition.  BLOOD LOSS:  300cc DRAINS: 1 hemovac, 1 pain catheter COMPLICATIONS:  None.  PLAN OF CARE: Admit to inpatient   PATIENT DISPOSITION:  PACU - hemodynamically stable.   Delay start of Pharmacological  VTE agent (>24hrs) due to surgical blood loss or risk of bleeding:  not applicable  Please fax a copy of this op note to my office at [phone] (please only include page 1 and 2 of the Case Information op note)

## 2014-06-04 NOTE — H&P (Signed)
  Adam Adam [name] MRN:  [id number] DOB/SEX:  1940-07-14/male  CHIEF COMPLAINT:  Painful right Knee  HISTORY: Patient is a 74 y.o. male presented with a history of pain in the right knee. Onset of symptoms was gradual starting several years ago with gradually worsening course since that time. Prior procedures on the knee include none. Patient has been treated conservatively with over-the-counter NSAIDs and activity modification. Patient currently rates pain in the knee at 9 out of 10 with activity. There is no pain at night.  PAST MEDICAL HISTORY: There are no active problems to display for this patient.  Past Medical History  Diagnosis Date  . Arthritis    Past Surgical History  Procedure Laterality Date  . No past surgeries       MEDICATIONS:   No prescriptions prior to admission    ALLERGIES:  No Known Allergies  REVIEW OF SYSTEMS:  A comprehensive review of systems was negative.   FAMILY HISTORY:  No family history on file.  SOCIAL HISTORY:   History  Substance Use Topics  . Smoking status: Former Smoker -- 1.00 packs/day for 2 years    Types: Cigarettes  . Smokeless tobacco: Not on file  . Alcohol Use: No     EXAMINATION:  Vital signs in last 24 hours:    General appearance: alert, cooperative and no distress Lungs: Adam [name] to auscultation bilaterally Heart: regular rate and rhythm, S1, S2 normal, no murmur, click, rub or gallop Abdomen: soft, non-tender; bowel sounds normal; no masses,  no organomegaly Extremities: extremities normal, atraumatic, no cyanosis or edema and Homans sign is negative, no sign of DVT Pulses: 2+ and symmetric Skin: Skin color, texture, turgor normal. No rashes or lesions Neurologic: Alert and oriented X 3, normal strength and tone. Normal symmetric reflexes. Normal coordination and gait  Musculoskeletal:  ROM 0-115, Ligaments intact,  Imaging Review Plain radiographs demonstrate severe degenerative joint disease of the right knee.  The overall alignment is significant varus. The bone quality appears to be good for age and reported activity level.  Assessment/Plan: Primary osteoarthritis, right knee   The patient history, physical examination and imaging studies are consistent with advanced degenerative joint disease of the right knee. The patient has failed conservative treatment.  The clearance notes were reviewed.  After discussion with the patient it was felt that Total Knee Replacement was indicated. The procedure,  risks, and benefits of total knee arthroplasty were presented and reviewed. The risks including but not limited to aseptic loosening, infection, blood clots, vascular injury, stiffness, patella tracking problems complications among others were discussed. The patient acknowledged the explanation, agreed to proceed with the plan.  Adam Adam [name] 06/04/2014, 6:41 AM

## 2014-06-05 ENCOUNTER — Encounter (HOSPITAL_COMMUNITY): Payer: Self-pay | Admitting: Orthopedic Surgery

## 2014-06-05 LAB — BASIC METABOLIC PANEL
Anion gap: 10 (ref 5–15)
BUN: 11 mg/dL (ref 6–23)
CHLORIDE: 98 meq/L (ref 96–112)
CO2: 27 mmol/L (ref 19–32)
Calcium: 8 mg/dL — ABNORMAL LOW (ref 8.4–10.5)
Creatinine, Ser: 1.01 mg/dL (ref 0.50–1.35)
GFR calc non Af Amer: 71 mL/min — ABNORMAL LOW (ref >90)
GFR, EST AFRICAN AMERICAN: 82 mL/min — AB (ref >90)
Glucose, Bld: 135 mg/dL — ABNORMAL HIGH (ref 70–99)
POTASSIUM: 4.3 mmol/L (ref 3.5–5.1)
Sodium: 135 mmol/L (ref 135–145)

## 2014-06-05 LAB — CBC
HEMATOCRIT: 34.8 % — AB (ref 39.0–52.0)
Hemoglobin: 11.5 g/dL — ABNORMAL LOW (ref 13.0–17.0)
MCH: 29.2 pg (ref 26.0–34.0)
MCHC: 33 g/dL (ref 30.0–36.0)
MCV: 88.3 fL (ref 78.0–100.0)
Platelets: 185 10*3/uL (ref 150–400)
RBC: 3.94 MIL/uL — ABNORMAL LOW (ref 4.22–5.81)
RDW: 13.5 % (ref 11.5–15.5)
WBC: 9.1 10*3/uL (ref 4.0–10.5)

## 2014-06-05 MED ORDER — SODIUM CHLORIDE 0.9 % IV BOLUS (SEPSIS)
500.0000 mL | Freq: Once | INTRAVENOUS | Status: AC
  Administered 2014-06-05: 500 mL via INTRAVENOUS

## 2014-06-05 NOTE — Progress Notes (Signed)
OT Cancellation Note  Patient Details Name: Adam [name] MRN: [id number] DOB: 1940/06/23   Cancelled Treatment:    Reason Eval/Treat Not Completed: Other (comment) Attempted to see again this pm. Pt vomiting and not feeling well. Will see in am. Alexander, OTR/L  [phone] 06/05/2014 06/05/2014, 5:24 PM

## 2014-06-05 NOTE — Progress Notes (Signed)
Orthopedic Tech Progress Note Patient Details:  Adam [name] [facility] [birth year] [id number] On cpm at 7:50 pm increased 5 degrees. 0-65 Patient ID: [name], male   DOB: 08-Jun-1940, 74 y.o.   MRN: [id number]   Adam [name] 06/05/2014, 7:50 PM

## 2014-06-05 NOTE — Progress Notes (Signed)
PA made aware of patients inability to void post removal of foley catheter at 1230. 500cc Bolus of NS given at 1720. Fluids will be running @75ml /hour until patient voids. DTV at 2030.

## 2014-06-05 NOTE — Progress Notes (Signed)
Physical Therapy Treatment Patient Details Name: Adam [name] MRN: [id number] DOB: 01-14-1941 Today's Date: 06/05/2014    History of Present Illness Patient is a 74 yo male admitted 06/04/14 now s/p Rt TKA.  PMH:  Arthritis    PT Comments    Pt ambulation greatly limited by dizziness/lightheadedness. Pt currently still functioning at a minA level and demo's instability and increased falls risk. PT to see again this PM however if unable to amb > 100' with RW pt unable to enter home and would benefit from another nights stay to achieve supervision level of function and increased ambulation tolerance for safe d/c home with spouse.   Follow Up Recommendations  Home health PT;Supervision/Assistance - 24 hour     Equipment Recommendations  None recommended by PT    Recommendations for Other Services       Precautions / Restrictions Precautions Precautions: Knee Precaution Comments: educated on importance of stretching R knee into extension Restrictions Weight Bearing Restrictions: Yes RLE Weight Bearing: Weight bearing as tolerated    Mobility  Bed Mobility Overal bed mobility: Needs Assistance Bed Mobility: Supine to Sit     Supine to sit: Min assist     General bed mobility comments: directional v/c's, minA for R LE management  Transfers Overall transfer level: Needs assistance Equipment used: Rolling walker (2 wheeled) Transfers: Sit to/from Stand Sit to Stand: Min assist         General transfer comment: v/c's for safe hand placement, increased time, minA to achieve full upright posture  Ambulation/Gait Ambulation/Gait assistance: +2 safety/equipment;Min assist Ambulation Distance (Feet): 20 Feet Assistive device: Rolling walker (2 wheeled) Gait Pattern/deviations: Step-to pattern;Decreased step length - right;Decreased stance time - right;Decreased stride length;Antalgic Gait velocity: slow   General Gait Details: ambulation greatly limited by dizziness and  pt report "I just don't feel well, i feel dizzy, weak, and clammy." Pt encouraged to push through it however had to stop and rest after 20 feet. BP 149/86 in sitting, 99/74 in standing.   Stairs            Wheelchair Mobility    Modified Rankin (Stroke Patients Only)       Balance Overall balance assessment: Needs assistance         Standing balance support: Bilateral upper extremity supported Standing balance-Leahy Scale: Poor Standing balance comment: due to dizziness and R LE pain pt requires bilat UE support                    Cognition Arousal/Alertness: Awake/alert Behavior During Therapy: WFL for tasks assessed/performed Overall Cognitive Status: Within Functional Limits for tasks assessed                      Exercises Total Joint Exercises Ankle Circles/Pumps: AROM;Both;10 reps;Seated Quad Sets: AROM;Right;10 reps;Supine Heel Slides: Right;10 reps;Supine;AAROM Goniometric ROM: 10-45 def R knee AA ROM    General Comments        Pertinent Vitals/Pain Pain Assessment: 0-10 Pain Score: 7  Pain Location: Rt knee Pain Descriptors / Indicators: Sore Pain Intervention(s): Monitored during session    Home Living                      Prior Function            PT Goals (current goals can now be found in the care plan section) Progress towards PT goals: Progressing toward goals    Frequency  7X/week  PT Plan Current plan remains appropriate    Co-evaluation             End of Session Equipment Utilized During Treatment: Gait belt Activity Tolerance: Patient limited by pain Patient left: in chair;with call bell/phone within reach;with family/visitor present     Time: 8979-1504 PT Time Calculation (min) (ACUTE ONLY): 23 min  Charges:  $Gait Training: 8-22 mins $Therapeutic Exercise: 8-22 mins                    G Codes:      Kingsley Callander 06/05/2014, 1:39 PM   Kittie Plater, PT, DPT Pager #:  [phone] Office #: [phone]

## 2014-06-05 NOTE — Progress Notes (Signed)
PT TREATMENT NOTE  Clinical Impression: Pt ambulation con't to be limited by onset of dizziness/lightheadedness. Pt did complete stair negotiation in effort to decrease ambulation distance into the house. Con't to recommend another night hospital stay due to pt requiring assist for transfers and limited ambulation tolerance.    06/05/14 1447  PT Visit Information  Last PT Received On 06/05/14  Assistance Needed +1  History of Present Illness Patient is a 74 yo male admitted 06/04/14 now s/p Rt TKA.  PMH:  Arthritis  PT Time Calculation  PT Start Time (ACUTE ONLY) 1447  PT Stop Time (ACUTE ONLY) 1507  PT Time Calculation (min) (ACUTE ONLY) 20 min  Subjective Data  Subjective "I"m feeling a little better."  Precautions  Precautions Knee  Precaution Comments educated on importance of stretching R knee into extension  Restrictions  Weight Bearing Restrictions Yes  RLE Weight Bearing WBAT  Pain Assessment  Pain Assessment 0-10  Pain Score 4  Pain Location Rt knee  Pain Descriptors / Indicators Sore  Pain Intervention(s) Monitored during session  Cognition  Arousal/Alertness Awake/alert  Behavior During Therapy WFL for tasks assessed/performed  Overall Cognitive Status Within Functional Limits for tasks assessed  Bed Mobility  Overal bed mobility Needs Assistance  Bed Mobility Supine to Sit  Supine to sit Min assist  General bed mobility comments directional v/c's, minA for R LE management  Transfers  Overall transfer level Needs assistance  Equipment used Rolling walker (2 wheeled)  Transfers Sit to/from Stand  Sit to Stand Min assist  General transfer comment v/c's for safe hand placement, increased time, minA to achieve full upright posture  Ambulation/Gait  Ambulation/Gait assistance Min assist  Ambulation Distance (Feet) 40 Feet  Assistive device Rolling walker (2 wheeled)  Gait Pattern/deviations Step-to pattern  Gait velocity slow  General Gait Details encouraged pt  to place foot flat during Rt LE wbing and transition to a more fluid gait pattern/sequential pattern. Pt c/o to be dizziness  Stairs Yes  Stairs assistance Min assist  Stair Management Two rails  Number of Stairs 2  General stair comments v/c's for sequencing,minimal R LE Wbing  Balance  Standing balance support Bilateral upper extremity supported  Standing balance-Leahy Scale Poor  PT - End of Session  Equipment Utilized During Treatment Gait belt  Activity Tolerance (limited by dizziness)  Patient left in chair;with call bell/phone within reach;with family/visitor present  Nurse Communication Mobility status  PT - Assessment/Plan  PT Plan Current plan remains appropriate  PT Frequency (ACUTE ONLY) 7X/week  Follow Up Recommendations Home health PT;Supervision/Assistance - 24 hour  PT equipment None recommended by PT  PT Goal Progression  Progress towards PT goals Progressing toward goals  PT General Charges  $$ ACUTE PT VISIT 1 Procedure  PT Treatments  $Gait Training 8-22 mins    Kittie Plater, PT, DPT Pager #: [phone] Office #: [phone]

## 2014-06-05 NOTE — Progress Notes (Signed)
OT Cancellation Note  Patient Details Name: Adam [name] MRN: [id number] DOB: 04-29-1941   Cancelled Treatment:    Reason Eval/Treat Not Completed: Other (comment)  Pt declined. Not feeling well. Will attempt again later if able. [place], OTR/L  [phone] 06/05/2014 06/05/2014, 3:14 PM

## 2014-06-05 NOTE — Progress Notes (Signed)
Spoke with Adam [name] regarding the foley catheter that was reinserted last PM. Will discontinue foley catheter and have patient attempt to void spontaneously. Will continue to monitor.

## 2014-06-05 NOTE — Progress Notes (Signed)
SPORTS MEDICINE AND JOINT REPLACEMENT  Adam Mulch, MD   Carlynn Spry, PA-C [place], [place], [place]  [address]                             [phone]   PROGRESS NOTE  Subjective:  negative for Chest Pain  negative for Shortness of Breath  negative for Nausea/Vomiting   negative for Calf Pain  negative for Bowel Movement   Tolerating Diet: yes         Patient reports pain as 5 on 0-10 scale.    Objective: Vital signs in last 24 hours:   Patient Vitals for the past 24 hrs:  BP Temp Pulse Resp SpO2  06/05/14 0600 104/82 mmHg 98.9 F (37.2 C) 93 16 99 %  06/05/14 0400 - - - 16 100 %  06/05/14 0200 (!) 145/87 mmHg 98.6 F (37 C) 96 16 100 %  06/05/14 0000 - - - 18 100 %  06/04/14 2119 (!) 156/89 mmHg 98.6 F (37 C) 89 18 100 %  06/04/14 2000 - - - 18 100 %    @flow {1959:LAST@   Intake/Output from previous day:   01/18 0701 - 01/19 0700 In: 1620 [P.O.:120; I.V.:1500] Out: 50    Intake/Output this shift:   01/19 0701 - 01/19 1900 In: 140 [I.V.:140] Out: 600 [Urine:600]   Intake/Output      01/18 0701 - 01/19 0700 01/19 0701 - 01/20 0700   P.O. 120    I.V. 1500 140   Total Intake 1620 140   Urine  600   Blood 50    Total Output 50 600   Net +1570 -460           LABORATORY DATA:  Recent Labs  06/04/14 1505 06/05/14 0505  WBC 7.2 9.1  HGB 12.5* 11.5*  HCT 37.8* 34.8*  PLT 211 185    Recent Labs  06/04/14 1505 06/05/14 0505  NA  --  135  K  --  4.3  CL  --  98  CO2  --  27  BUN  --  11  CREATININE 0.92 1.01  GLUCOSE  --  135*  CALCIUM  --  8.0*   Lab Results  Component Value Date   INR 1.02 05/23/2014    Examination:  General appearance: alert, cooperative and no distress Extremities: Homans sign is negative, no sign of DVT  Wound Exam: clean, dry, intact   Drainage:  Scant/small amount Serosanguinous exudate  Motor Exam: EHL and FHL Intact  Sensory Exam: Deep Peroneal normal   Assessment:    1 Day Post-Op   Procedure(s) (LRB): TOTAL KNEE ARTHROPLASTY (Right)  ADDITIONAL DIAGNOSIS:  Active Problems:   S/P total knee arthroplasty  Acute Blood Loss Anemia   Plan: Physical Therapy as ordered Weight Bearing as Tolerated (WBAT)  DVT Prophylaxis:  Lovenox  DISCHARGE PLAN: Home  DISCHARGE NEEDS: HHPT, CPM, Walker and 3-in-1 comode seat         Tere Mcconaughey 06/05/2014, 4:39 PM

## 2014-06-05 NOTE — Progress Notes (Signed)
Pt unable to void stated having pressure in lower abdomen, bladder scanned noted 575 ml tried to in and out cath with no success paged on call MD received order to place foley. Placed 16 F foley.

## 2014-06-05 NOTE — Progress Notes (Signed)
CARE MANAGEMENT NOTE 06/05/2014  Patient:  DONNE, BALEY   Account Number:  [id number]  Date Initiated:  06/05/2014  Documentation initiated by:  [facility]  Subjective/Objective Assessment:   s/p rt TKA     Action/Plan:   PT/OT evals-recommended HHPT   Anticipated DC Date:  06/06/2014   Anticipated DC Plan:  [place]  CM consult      [facility] Choice  [place]   Choice offered to / List presented to:  C-1 Patient   DME arranged  3-N-1  [place]  CPM      DME agency  [employer]     [place] arranged  HH-2 PT      [place]   Status of service:  Completed, signed off Medicare Important Message given?   (If response is "NO", the following Medicare IM given date fields will be blank) Date Medicare IM given:   Medicare IM given by:   Date Additional Medicare IM given:   Additional Medicare IM given by:    Discharge Disposition:  [place]  Per UR Regulation:  Reviewed for med. necessity/level of care/duration of stay  If discussed at Long Length of Stay Meetings, dates discussed:    Comments:  06/05/14 Set up with [place] for HHPT by MD office.Spoke with patient, no change in d/c plan. [employer] provided CPM, 3N1 and rolling walker.

## 2014-06-06 LAB — CBC
HCT: 31.3 % — ABNORMAL LOW (ref 39.0–52.0)
Hemoglobin: 10.5 g/dL — ABNORMAL LOW (ref 13.0–17.0)
MCH: 29.4 pg (ref 26.0–34.0)
MCHC: 33.5 g/dL (ref 30.0–36.0)
MCV: 87.7 fL (ref 78.0–100.0)
PLATELETS: 163 10*3/uL (ref 150–400)
RBC: 3.57 MIL/uL — ABNORMAL LOW (ref 4.22–5.81)
RDW: 13.5 % (ref 11.5–15.5)
WBC: 9.3 10*3/uL (ref 4.0–10.5)

## 2014-06-06 MED ORDER — TAMSULOSIN HCL 0.4 MG PO CAPS
0.4000 mg | ORAL_CAPSULE | Freq: Every day | ORAL | Status: DC
  Administered 2014-06-06 – 2014-06-07 (×2): 0.4 mg via ORAL
  Filled 2014-06-06 (×2): qty 1

## 2014-06-06 MED ORDER — OXYCODONE HCL 5 MG PO TABS: 5.0000 mg | ORAL_TABLET | ORAL | Status: DC | PRN

## 2014-06-06 MED ORDER — ENOXAPARIN SODIUM 40 MG/0.4ML ~~LOC~~ SOLN: 40.0000 mg | SUBCUTANEOUS | Status: DC

## 2014-06-06 MED ORDER — SODIUM CHLORIDE 0.9 % IV BOLUS (SEPSIS)
500.0000 mL | Freq: Once | INTRAVENOUS | Status: AC
  Administered 2014-06-06: 500 mL via INTRAVENOUS

## 2014-06-06 MED ORDER — OXYCODONE HCL ER 10 MG PO T12A: 10.0000 mg | EXTENDED_RELEASE_TABLET | Freq: Two times a day (BID) | ORAL | Status: DC

## 2014-06-06 MED ORDER — METHOCARBAMOL 500 MG PO TABS: 500.0000 mg | ORAL_TABLET | Freq: Four times a day (QID) | ORAL | Status: DC | PRN

## 2014-06-06 MED ORDER — CELECOXIB 200 MG PO CAPS: 200.0000 mg | ORAL_CAPSULE | Freq: Two times a day (BID) | ORAL | Status: DC

## 2014-06-06 NOTE — Progress Notes (Signed)
SPORTS MEDICINE AND JOINT REPLACEMENT  Adam Mulch, MD   Carlynn Spry, PA-C [place], [place], [place]  [address]                             [phone]   PROGRESS NOTE  Subjective:  negative for Chest Pain  negative for Shortness of Breath  negative for Nausea/Vomiting   negative for Calf Pain  negative for Bowel Movement   Tolerating Diet: yes         Patient reports pain as 4 on 0-10 scale.    Objective: Vital signs in last 24 hours:   Patient Vitals for the past 24 hrs:  BP Temp Pulse Resp SpO2  06/06/14 1200 - - - 18 -  06/06/14 0800 - - - 18 -  06/06/14 0600 (!) 142/73 mmHg 98.8 F (37.1 C) 100 16 98 %  06/05/14 2200 (!) 148/84 mmHg 98.7 F (37.1 C) 98 16 97 %  06/05/14 1400 (!) 143/77 mmHg 99.2 F (37.3 C) 94 18 96 %    @flow {1959:LAST@   Intake/Output from previous day:   01/19 0701 - 01/20 0700 In: 2436 [P.O.:600; I.V.:686] Out: 650 [Urine:650]   Intake/Output this shift:   01/20 0701 - 01/20 1900 In: 238.8 [I.V.:238.8] Out: -    Intake/Output      01/19 0701 - 01/20 0700 01/20 0701 - 01/21 0700   P.O. 600    I.V. 686 238.8   Other 1150    Total Intake 2436 238.8   Urine 650    Blood     Total Output 650     Net +1786 +238.8        Urine Occurrence 3 x       LABORATORY DATA:  Recent Labs  06/04/14 1505 06/05/14 0505 06/06/14 0525  WBC 7.2 9.1 9.3  HGB 12.5* 11.5* 10.5*  HCT 37.8* 34.8* 31.3*  PLT 211 185 163    Recent Labs  06/04/14 1505 06/05/14 0505  NA  --  135  K  --  4.3  CL  --  98  CO2  --  27  BUN  --  11  CREATININE 0.92 1.01  GLUCOSE  --  135*  CALCIUM  --  8.0*   Lab Results  Component Value Date   INR 1.02 05/23/2014    Examination:  General appearance: alert, appears stated age and no distress Extremities: extremities normal, atraumatic, no cyanosis or edema and Homans sign is negative, no sign of DVT  Wound Exam: clean, dry, intact   Drainage:  None: wound tissue dry  Motor Exam:  EHL and FHL Intact  Sensory Exam: Deep Peroneal normal   Assessment:    2 Days Post-Op  Procedure(s) (LRB): TOTAL KNEE ARTHROPLASTY (Right)  ADDITIONAL DIAGNOSIS:  Active Problems:   S/P total knee arthroplasty  Acute Blood Loss Anemia   Plan: Physical Therapy as ordered Weight Bearing as Tolerated (WBAT)  DVT Prophylaxis:  Lovenox  DISCHARGE PLAN: Home  DISCHARGE NEEDS: HHPT, CPM, Walker and 3-in-1 comode seat         Adam [name] 06/06/2014, 1:17 PM

## 2014-06-06 NOTE — Progress Notes (Signed)
Occupational Therapy Evaluation Patient Details Name: Adam [name] MRN: [id number] DOB: October 31, 1940 Today's Date: 06/06/2014    History of Present Illness Patient is a 74 yo male admitted 06/04/14 now s/p Rt TKA.  PMH:  Arthritis   Clinical Impression   Pt progressing well. Completed all education regarding compensatory techniques for ADL and functional mobility for ADL. Wife present for session and able to return demonstrate. Pt ready to D/C home with initial 24/7 S when medically stable. OT signing off.    Follow Up Recommendations  No OT follow up;Supervision/Assistance - 24 hour (initially)    Equipment Recommendations  None recommended by OT    Recommendations for Other Services       Precautions / Restrictions Precautions Precautions: Knee Restrictions RLE Weight Bearing: Weight bearing as tolerated      Mobility Bed Mobility Overal bed mobility: Modified Independent                Transfers Overall transfer level: Needs assistance   Transfers: Sit to/from Stand;Stand Pivot Transfers Sit to Stand: Supervision Stand pivot transfers: Supervision       General transfer comment: good carry over from earlier PT session    Balance Overall balance assessment: Needs assistance           Standing balance-Leahy Scale: Fair                              ADL Overall ADL's : Needs assistance/impaired               Lower Body Bathing Details (indicate cue type and reason): min A     Lower Body Dressing: Minimal assistance   Toilet Transfer: Supervision/safety;Ambulation;Comfort height toilet   Toileting- Clothing Manipulation and Hygiene: Supervision/safety;Sit to/from stand   Tub/ Shower Transfer: Min guard;Ambulation;Rolling walker;Tub transfer Tub/Shower Transfer Details (indicate cue type and reason): Educated pt/wife on trasnfer technique using 3 in 1. Pt able to return demonstrte Functional mobility during ADLs:  Supervision/safety;Rolling walker;Cueing for safety General ADL Comments: Educated on home safety and home modifications to reduce risk of falls.     Vision                     Perception     Praxis      Pertinent Vitals/Pain Pain Assessment: 0-10 Pain Score: 3  Pain Location: R knee Pain Descriptors / Indicators: Aching Pain Intervention(s): Limited activity within patient's tolerance;Monitored during session;Repositioned     Hand Dominance Right   Extremity/Trunk Assessment Upper Extremity Assessment Upper Extremity Assessment: Overall WFL for tasks assessed   Lower Extremity Assessment Lower Extremity Assessment: Defer to PT evaluation   Cervical / Trunk Assessment Cervical / Trunk Assessment: Normal   Communication Communication Communication: No difficulties   Cognition Arousal/Alertness: Awake/alert Behavior During Therapy: WFL for tasks assessed/performed Overall Cognitive Status: Within Functional Limits for tasks assessed                     General Comments       Exercises       Shoulder Instructions      Home Living Family/patient expects to be discharged to:: Private residence Living Arrangements: Spouse/significant other Available Help at Discharge: Family;Available 24 hours/day Type of Home: House Home Access: Ramped entrance     Home Layout: One level     Bathroom Shower/Tub: Tub/shower unit Shower/tub characteristics: Architectural technologist: Standard Bathroom Accessibility: Yes How  Accessible: Accessible via walker Home Equipment: [place] - 2 wheels;Bedside commode          Prior Functioning/Environment Level of Independence: Independent             OT Diagnosis: Generalized weakness;Acute pain   OT Problem List: Decreased strength;Decreased range of motion;Decreased activity tolerance;Pain;Decreased knowledge of use of DME or AE   OT Treatment/Interventions:      OT Goals(Current goals can be found in  the care plan section) Acute Rehab OT Goals Patient Stated Goal: To go home tomorrow OT Goal Formulation: All assessment and education complete, DC therapy  OT Frequency:     Barriers to D/C:            Co-evaluation              End of Session    Activity Tolerance:  tolerated well Patient left:  in chair with call bell within reach; wife present.[email or web address]   Time: 1200-1220 OT Time Calculation (min): 20 min Charges:  OT General Charges $OT Visit: 1 Procedure OT Evaluation $Initial OT Evaluation Tier I: 1 Procedure G-Codes:    Saanvika Vazques,HILLARY 06/10/2014, 1:54 PM [facility], OTR/L  [phone] 06-10-2014

## 2014-06-06 NOTE — Progress Notes (Signed)
PT TREATMENT NOTE  Clinical Impression: Pt with improved gait pattern but con't to have increased R knee pain with WBing thus relying on bilat UEs. Pt frustrated with inability to urinate. Acute PT to con't to progress mobility as able.    06/06/14 1451  PT Visit Information  Last PT Received On 06/06/14  Assistance Needed +1  History of Present Illness Patient is a 74 yo male admitted 06/04/14 now s/p Rt TKA.  PMH:  Arthritis  PT Time Calculation  PT Start Time (ACUTE ONLY) 1451  PT Stop Time (ACUTE ONLY) 1506  PT Time Calculation (min) (ACUTE ONLY) 15 min  Subjective Data  Subjective "i just got into the machine"  Precautions  Precautions Knee  Restrictions  Weight Bearing Restrictions Yes  RLE Weight Bearing WBAT  Pain Assessment  Pain Assessment 0-10  Pain Score 5  Pain Location R knee  Pain Descriptors / Indicators Sore  Pain Intervention(s) Limited activity within patient's tolerance  Cognition  Arousal/Alertness Awake/alert  Behavior During Therapy WFL for tasks assessed/performed  Overall Cognitive Status Within Functional Limits for tasks assessed  Bed Mobility  Overal bed mobility Modified Independent  Transfers  Overall transfer level Needs assistance  Equipment used Rolling walker (2 wheeled)  Transfers Sit to/from Stand  Sit to Stand Supervision  General transfer comment improved technique  Ambulation/Gait  Ambulation/Gait assistance Min guard  Ambulation Distance (Feet) 150 Feet  Assistive device Rolling walker (2 wheeled)  Gait Pattern/deviations Step-through pattern  Gait velocity slow  General Gait Details pt with improved fluidity of gait pattern with PT pushing RW forward and max directional cues to achieve terminal knee extension  Exercises  Exercises Total Joint  Total Joint Exercises  Long Arc Quad AROM;Left;10 reps (with PT preventing hip flexion)  PT - End of Session  Equipment Utilized During Treatment Gait belt  Activity Tolerance Patient  tolerated treatment well  Patient left in bed;with call bell/phone within reach;in CPM;with family/visitor present  Nurse Communication Mobility status  PT - Assessment/Plan  PT Plan Current plan remains appropriate  PT Frequency (ACUTE ONLY) 7X/week  Follow Up Recommendations Home health PT;Supervision/Assistance - 24 hour  PT equipment None recommended by PT  PT Goal Progression  Progress towards PT goals Progressing toward goals  PT General Charges  $$ ACUTE PT VISIT 1 Procedure  PT Treatments  $Gait Training 8-22 mins    Kittie Plater, PT, DPT Pager #: [phone] Office #: [phone]

## 2014-06-06 NOTE — Progress Notes (Signed)
Pt due to void at 8:30 pm 1/18. Voided small amounts x2, 150 and then 50 ccs. Bladder scan @ 10:45 pm showed 945 ccs, pt complained of abdominal pain. Notified Doran Stabler, on call for Dr. Ronnie Derby. Received order to attempt in and out cath and if unsuccessful to reinsert coude catheter. Coude catheter was inserted around 2 am.   Raquel James  06/06/2014

## 2014-06-06 NOTE — Progress Notes (Signed)
Physical Therapy Treatment Patient Details Name: Adam [name] MRN: [id number] DOB: [birth year] Today's Date: 06/06/2014    History of Present Illness Patient is a 74 yo male admitted 06/04/14 now s/p Rt TKA.  PMH:  Arthritis    PT Comments    Pt with improved ambulation tolerance however con't to have increased R LE pain causing antalgic gait pattern and limiting R LE WBing. Acute PT to con't to follow to progress mobility as able.   Follow Up Recommendations  Home health PT;Supervision/Assistance - 24 hour     Equipment Recommendations  None recommended by PT    Recommendations for Other Services       Precautions / Restrictions Precautions Precautions: Knee Precaution Comments: pt reports being in CPM and using foam Restrictions Weight Bearing Restrictions: Yes RLE Weight Bearing: Weight bearing as tolerated    Mobility  Bed Mobility Overal bed mobility: Needs Assistance Bed Mobility: Supine to Sit     Supine to sit: Min guard     General bed mobility comments: pt with improved ability to manage R LE  Transfers Overall transfer level: Needs assistance Equipment used: Rolling walker (2 wheeled) Transfers: Sit to/from Stand Sit to Stand: Min guard         General transfer comment: increased time, labored effort, v/c's for hand placement  Ambulation/Gait Ambulation/Gait assistance: Min assist Ambulation Distance (Feet): 100 Feet (x2) Assistive device: Rolling walker (2 wheeled) Gait Pattern/deviations: Step-to pattern;Step-through pattern;Antalgic Gait velocity: slow   General Gait Details: PT moved walker forward in effort achieve more fluid gait pattern. emphasis on heel toe gait pattern and increasing R knee extension during stance phase and R knee flexion during swing phase.Pt with report of dizzines but more mild compared to yesterday.   Stairs            Wheelchair Mobility    Modified Rankin (Stroke Patients Only)       Balance                                    Cognition Arousal/Alertness: Awake/alert Behavior During Therapy: WFL for tasks assessed/performed Overall Cognitive Status: Within Functional Limits for tasks assessed                      Exercises Total Joint Exercises Ankle Circles/Pumps: AROM;Both;10 reps;Seated Quad Sets: AROM;Right;10 reps;Supine Heel Slides: Right;10 reps;Supine;AAROM Goniometric ROM: 8-60    General Comments        Pertinent Vitals/Pain Pain Assessment: 0-10 Pain Score: 7  Pain Location: Rt knee Pain Descriptors / Indicators: Sore Pain Intervention(s): Monitored during session    Home Living                      Prior Function            PT Goals (current goals can now be found in the care plan section) Progress towards PT goals: Progressing toward goals    Frequency  7X/week    PT Plan Current plan remains appropriate    Co-evaluation             End of Session Equipment Utilized During Treatment: Gait belt   Patient left: in chair;with call bell/phone within reach;with family/visitor present     Time: 4650-3546 PT Time Calculation (min) (ACUTE ONLY): 27 min  Charges:  $Gait Training: 8-22 mins $Therapeutic Exercise: 8-22 mins  G CodesKingsley [name] 06/06/2014, 10:51 AM   Adam [name], PT, DPT Pager #: [phone] Office #: [phone]

## 2014-06-06 NOTE — Discharge Instructions (Signed)
Diet: As you were doing prior to hospitalization   Activity:  Increase activity slowly as tolerated                  No lifting or driving for 6 weeks  Shower:  May shower without a dressing once there is no drainage from your wound.                 Do NOT wash over the wound.                 Dressing:  You may change your dressing on Thursday                    Then change the dressing daily with sterile 4"x4"s gauze dressing                     And TED hose for knees.  Weight Bearing:  Weight bearing as tolerated as taught in physical therapy.  Use a                                walker or Crutches as instructed.  To prevent constipation: you may use a stool softener such as -               Colace ( over the counter) 100 mg by mouth twice a day                Drink plenty of fluids ( prune juice may be helpful) and high fiber foods                Miralax ( over the counter) for constipation as needed.    Precautions:  If you experience chest pain or shortness of breath - call 911 immediately               For transfer to the hospital emergency department!!               If you develop a fever greater that 101 F, purulent drainage from wound,                             increased redness or drainage from wound, or calf pain -- Call the office.  Follow- Up Appointment:  Please call for an appointment to be seen on 06/19/14                                              [facility] office:  [phone]            [address] [place], [place] [address]

## 2014-06-07 ENCOUNTER — Encounter (HOSPITAL_COMMUNITY): Payer: Self-pay | Admitting: General Practice

## 2014-06-07 LAB — CBC
HCT: 24.3 % — ABNORMAL LOW (ref 39.0–52.0)
Hemoglobin: 8.2 g/dL — ABNORMAL LOW (ref 13.0–17.0)
MCH: 30 pg (ref 26.0–34.0)
MCHC: 33.7 g/dL (ref 30.0–36.0)
MCV: 89 fL (ref 78.0–100.0)
PLATELETS: 125 10*3/uL — AB (ref 150–400)
RBC: 2.73 MIL/uL — ABNORMAL LOW (ref 4.22–5.81)
RDW: 13.6 % (ref 11.5–15.5)
WBC: 5.4 10*3/uL (ref 4.0–10.5)

## 2014-06-07 MED ORDER — TAMSULOSIN HCL 0.4 MG PO CAPS: 0.4000 mg | ORAL_CAPSULE | Freq: Every day | ORAL | Status: DC

## 2014-06-07 NOTE — Progress Notes (Signed)
SPORTS MEDICINE AND JOINT REPLACEMENT  Lara Mulch, MD   Carlynn Spry, PA-C [place], [place], [place]  [address]                             [phone]   PROGRESS NOTE  Subjective:  negative for Chest Pain  negative for Shortness of Breath  negative for Nausea/Vomiting   negative for Calf Pain  negative for Bowel Movement   Tolerating Diet: yes         Patient reports pain as 5 on 0-10 scale.    Objective: Vital signs in last 24 hours:   Patient Vitals for the past 24 hrs:  BP Temp Pulse Resp SpO2  06/07/14 0800 - - - 18 -  06/07/14 0600 124/63 mmHg 98.5 F (36.9 C) 92 16 97 %  06/06/14 2125 (!) 146/78 mmHg 99.4 F (37.4 C) (!) 113 18 97 %  06/06/14 1539 - - - 18 -  06/06/14 1400 126/80 mmHg 98.9 F (37.2 C) (!) 111 18 96 %    @flow {1959:LAST@   Intake/Output from previous day:   01/20 0701 - 01/21 0700 In: 1318.8 [P.O.:1080; I.V.:238.8] Out: 1650 [Urine:1650]   Intake/Output this shift:       Intake/Output      01/20 0701 - 01/21 0700 01/21 0701 - 01/22 0700   P.O. 1080    I.V. 238.8    Other     Total Intake 1318.8     Urine 1650    Total Output 1650     Net -331.3             LABORATORY DATA:  Recent Labs  06/04/14 1505 06/05/14 0505 06/06/14 0525 06/07/14 0602  WBC 7.2 9.1 9.3 5.4  HGB 12.5* 11.5* 10.5* 8.2*  HCT 37.8* 34.8* 31.3* 24.3*  PLT 211 185 163 125*    Recent Labs  06/04/14 1505 06/05/14 0505  NA  --  135  K  --  4.3  CL  --  98  CO2  --  27  BUN  --  11  CREATININE 0.92 1.01  GLUCOSE  --  135*  CALCIUM  --  8.0*   Lab Results  Component Value Date   INR 1.02 05/23/2014    Examination:  General appearance: alert, cooperative and no distress Extremities: extremities normal, atraumatic, no cyanosis or edema and Homans sign is negative, no sign of DVT  Wound Exam: clean, dry, intact   Drainage:  None: wound tissue dry  Motor Exam: EHL and FHL Intact  Sensory Exam: Deep Peroneal  normal   Assessment:    3 Days Post-Op  Procedure(s) (LRB): TOTAL KNEE ARTHROPLASTY (Right)  ADDITIONAL DIAGNOSIS:  Active Problems:   S/P total knee arthroplasty  Acute Blood Loss Anemia   Plan: Physical Therapy as ordered Weight Bearing as Tolerated (WBAT)  DVT Prophylaxis:  Lovenox  DISCHARGE PLAN: Home  DISCHARGE NEEDS: HHPT, CPM, Walker and 3-in-1 comode seat         Tian Davison 06/07/2014, 1:15 PM

## 2014-06-07 NOTE — Progress Notes (Signed)
06/07/14 Patient will be going home with foley, HHRN added. Contacted Jeziah Kretschmer with [facility] and set up [facility].  06/05/14 Set up with [facility] for HHPT by MD office.Spoke with patient, no change in d/c plan. [employer] provided CPM, 3N1 and rolling walker.

## 2014-06-07 NOTE — Progress Notes (Signed)
Physical Therapy Treatment Patient Details Name: Adam [name] MRN: [id number] DOB: 1940-06-21 Today's Date: 06/07/2014    History of Present Illness Patient is a 74 yo male admitted 06/04/14 now s/p Rt TKA.  PMH:  Arthritis    PT Comments    Pt was seen with PT and covered HEP, gait sequence and ROM during gait to cover his need for better hip ext and DF on RLE esp to sequence step better.  Pt is prepared to discharge home today and HHPT is planned for follow up which is appropriate  Follow Up Recommendations  Home health PT;Supervision/Assistance - 24 hour     Equipment Recommendations  None recommended by PT    Recommendations for Other Services       Precautions / Restrictions Precautions Precautions: Knee Precaution Booklet Issued: Yes (comment) Precaution Comments: Pt is familiar with precautions Restrictions Weight Bearing Restrictions: Yes RLE Weight Bearing: Weight bearing as tolerated    Mobility  Bed Mobility Overal bed mobility: Modified Independent Bed Mobility: Supine to Sit     Supine to sit: Supervision     General bed mobility comments: Pt was able to get to bedside with no PT assistance after helping him with CPM  Transfers Overall transfer level: Modified independent Equipment used: Rolling walker (2 wheeled) Transfers: Sit to/from [facility] Sit to Stand: Supervision Stand pivot transfers: Supervision       General transfer comment: careful with sitting down and is noting safety with hand placement  Ambulation/Gait Ambulation/Gait assistance: Supervision;Min guard Ambulation Distance (Feet): 200 Feet Assistive device: Rolling walker (2 wheeled) Gait Pattern/deviations: Step-through pattern;Wide base of support;Drifts right/left Gait velocity: slow Gait velocity interpretation: Below normal speed for age/gender General Gait Details: prompting mainly for lengthening R hip ext and to DF with more effectife heel to toe  movement    Stairs            Wheelchair Mobility    Modified Rankin (Stroke Patients Only)       Balance Overall balance assessment: Needs assistance Sitting-balance support: Feet supported       Standing balance support: Bilateral upper extremity supported Standing balance-Leahy Scale: Fair                      Cognition Arousal/Alertness: Awake/alert Behavior During Therapy: WFL for tasks assessed/performed Overall Cognitive Status: Within Functional Limits for tasks assessed                      Exercises Total Joint Exercises Ankle Circles/Pumps: AROM;Both;5 reps Gluteal Sets: AROM;Both;5 reps Hip ABduction/ADduction: AROM;Both;5 reps Knee Flexion: AAROM;Right;10 reps Goniometric ROM: -8 to 70    General Comments        Pertinent Vitals/Pain Pain Assessment: 0-10 Pain Score: 3  Pain Location: R knee Pain Intervention(s): Limited activity within patient's tolerance;Monitored during session;Premedicated before session;Repositioned  BP was 124/63, pulse 92 and O2 sats 97% per nsg notes    Home Living                      Prior Function            PT Goals (current goals can now be found in the care plan section) Acute Rehab PT Goals Patient Stated Goal: To go home tomorrow Progress towards PT goals: Progressing toward goals    Frequency  7X/week    PT Plan Current plan remains appropriate    Co-evaluation  End of Session Equipment Utilized During Treatment: Gait belt Activity Tolerance: Patient tolerated treatment well Patient left: in chair;with call bell/phone within reach;with family/visitor present     Time: 0802-0833 PT Time Calculation (min) (ACUTE ONLY): 31 min  Charges:  $Gait Training: 8-22 mins $Therapeutic Exercise: 8-22 mins                    G Codes:      Ramond Dial 2014/06/11, 10:33 AM   Mee Hives, PT MS Acute Rehab Dept. Number: [phone]

## 2014-06-07 NOTE — Progress Notes (Signed)
D/c instructions and scripts given to Pt. IV D/Cd  And I changed the foley to a leg bag for home use. Pts wife at the bedside and they verbalized understanding of discharge care. A home health RN has been set up to assist Pt with cath at home. Pt is going home with wife at this time.

## 2014-06-11 DIAGNOSIS — M7121 Synovial cyst of popliteal space [Baker], right knee: Secondary | ICD-10-CM | POA: Diagnosis not present

## 2014-06-11 DIAGNOSIS — Z96651 Presence of right artificial knee joint: Secondary | ICD-10-CM | POA: Diagnosis not present

## 2014-06-11 DIAGNOSIS — R2241 Localized swelling, mass and lump, right lower limb: Secondary | ICD-10-CM | POA: Diagnosis not present

## 2014-06-11 DIAGNOSIS — M79604 Pain in right leg: Secondary | ICD-10-CM | POA: Diagnosis not present

## 2014-06-12 DIAGNOSIS — Z960 Presence of urogenital implants: Secondary | ICD-10-CM | POA: Diagnosis not present

## 2014-06-12 DIAGNOSIS — Z7901 Long term (current) use of anticoagulants: Secondary | ICD-10-CM | POA: Diagnosis not present

## 2014-06-12 DIAGNOSIS — R339 Retention of urine, unspecified: Secondary | ICD-10-CM | POA: Diagnosis not present

## 2014-06-12 DIAGNOSIS — Z87891 Personal history of nicotine dependence: Secondary | ICD-10-CM | POA: Diagnosis not present

## 2014-06-12 DIAGNOSIS — Z96651 Presence of right artificial knee joint: Secondary | ICD-10-CM | POA: Diagnosis not present

## 2014-06-12 DIAGNOSIS — N401 Enlarged prostate with lower urinary tract symptoms: Secondary | ICD-10-CM | POA: Diagnosis not present

## 2014-06-12 DIAGNOSIS — R338 Other retention of urine: Secondary | ICD-10-CM | POA: Diagnosis not present

## 2014-06-12 DIAGNOSIS — Z471 Aftercare following joint replacement surgery: Secondary | ICD-10-CM | POA: Diagnosis not present

## 2014-06-13 DIAGNOSIS — Z471 Aftercare following joint replacement surgery: Secondary | ICD-10-CM | POA: Diagnosis not present

## 2014-06-13 DIAGNOSIS — R339 Retention of urine, unspecified: Secondary | ICD-10-CM | POA: Diagnosis not present

## 2014-06-13 DIAGNOSIS — Z87891 Personal history of nicotine dependence: Secondary | ICD-10-CM | POA: Diagnosis not present

## 2014-06-13 DIAGNOSIS — Z7901 Long term (current) use of anticoagulants: Secondary | ICD-10-CM | POA: Diagnosis not present

## 2014-06-13 DIAGNOSIS — Z960 Presence of urogenital implants: Secondary | ICD-10-CM | POA: Diagnosis not present

## 2014-06-13 DIAGNOSIS — Z96651 Presence of right artificial knee joint: Secondary | ICD-10-CM | POA: Diagnosis not present

## 2014-06-13 NOTE — Discharge Summary (Signed)
SPORTS MEDICINE & JOINT REPLACEMENT   Lara Mulch, MD   Carlynn Spry, PA-C [place], [place], [place]  [address]                             [phone]  PATIENT ID: Adam [name]        MRN:  [id number]          DOB/AGE: [birth year] / 74 y.o.    DISCHARGE SUMMARY  ADMISSION DATE:    06/04/2014 DISCHARGE DATE:   06/07/2014   ADMISSION DIAGNOSIS: primary osteoarthritis right knee    DISCHARGE DIAGNOSIS:  primary osteoarthritis right knee    ADDITIONAL DIAGNOSIS: Active Problems:   S/P total knee arthroplasty  Past Medical History  Diagnosis Date  . Complication of anesthesia   . PONV (postoperative nausea and vomiting)   . Arthritis     PROCEDURE: Procedure(s): TOTAL KNEE ARTHROPLASTY on 06/04/2014  CONSULTS:     HISTORY:  See H&P in chart  HOSPITAL COURSE:  Adam [name] is a 74 y.o. admitted on 06/04/2014 and found to have a diagnosis of primary osteoarthritis right knee.  After appropriate laboratory studies were obtained  they were taken to the operating room on 06/04/2014 and underwent Procedure(s): TOTAL KNEE ARTHROPLASTY.   They were given perioperative antibiotics:  Anti-infectives    Start     Dose/Rate Route Frequency Ordered Stop   06/04/14 1500  ceFAZolin (ANCEF) IVPB 2 g/50 mL premix     2 g100 mL/hr over 30 Minutes Intravenous Every 6 hours 06/04/14 1443 06/04/14 2222   06/04/14 0911  ceFAZolin (ANCEF) 2-3 GM-% IVPB SOLR    Comments:  Ara Kussmaul   : cabinet override      06/04/14 0911 06/04/14 1130   06/03/14 1119  ceFAZolin (ANCEF) IVPB 2 g/50 mL premix  Status:  Discontinued     2 g100 mL/hr over 30 Minutes Intravenous On call to O.R. 06/03/14 1119 06/04/14 1443    .  Tolerated the procedure well.  Placed with a foley intraoperatively.  Given Ofirmev at induction and for 48 hours.    POD# 1: Vital signs were stable.  Patient denied Chest pain, shortness of breath, or calf pain.  Patient was started on Lovenox 30 mg subcutaneously  twice daily at 8am.  Consults to PT, OT, and care management were made.  The patient was weight bearing as tolerated.  CPM was placed on the operative leg 0-90 degrees for 6-8 hours a day.  Incentive spirometry was taught.  Dressing was changed.  Hemovac was discontinued.      POD #2, Continued  PT for ambulation and exercise program.  IV saline locked.  O2 discontinued.    The remainder of the hospital course was dedicated to ambulation and strengthening.   The patient was discharged on 3 days post op in  Good condition.  Blood products given:none  DIAGNOSTIC STUDIES: Recent vital signs: No data found.      Recent laboratory studies:  Recent Labs  06/07/14 0602  WBC 5.4  HGB 8.2*  HCT 24.3*  PLT 125*   No results for input(s): NA, K, CL, CO2, BUN, CREATININE, GLUCOSE, CALCIUM in the last 168 hours. Lab Results  Component Value Date   INR 1.02 05/23/2014     Recent Radiographic Studies :  Dg Chest 2 View  05/23/2014   CLINICAL DATA:  Preoperative exam prior to total knee arthroplasty.  EXAM: CHEST  2 VIEW  COMPARISON:  None.  FINDINGS: The lungs are mildly hyperinflated and clear. The heart and pulmonary vascularity are normal. The mediastinum is normal in width. There is no pleural effusion or pneumothorax.  IMPRESSION: Mild hyperinflation may be voluntary or could reflect underlying COPD or reactive airway disease. There is no active cardiopulmonary disease.   Electronically Signed   By: Adam  [name]   On: 05/23/2014 13:59    DISCHARGE INSTRUCTIONS: Discharge Instructions    CPM    Complete by:  As directed   Continuous passive motion machine (CPM):      Use the CPM from 0 to 90 for 6-8 hours per day.      You may increase by 10 per day.  You may break it up into 2 or 3 sessions per day.      Use CPM for 2 weeks or until you are told to stop.     Call MD / Call 911    Complete by:  As directed   If you experience chest pain or shortness of breath, CALL 911 and be  transported to the hospital emergency room.  If you develope a fever above 101 F, pus (white drainage) or increased drainage or redness at the wound, or calf pain, call your surgeon's office.     Change dressing    Complete by:  As directed   Change dressing on Friday, then change the dressing daily with sterile 4 x 4 inch gauze dressing and apply TED hose.     Constipation Prevention    Complete by:  As directed   Drink plenty of fluids.  Prune juice may be helpful.  You may use a stool softener, such as Colace (over the counter) 100 mg twice a day.  Use MiraLax (over the counter) for constipation as needed.     Diet - low sodium heart healthy    Complete by:  As directed      Do not put a pillow under the knee. Place it under the heel.    Complete by:  As directed      Driving restrictions    Complete by:  As directed   No driving for 6 weeks     Increase activity slowly as tolerated    Complete by:  As directed      Lifting restrictions    Complete by:  As directed   No lifting for 6 weeks     TED hose    Complete by:  As directed   Use stockings (TED hose) for 2 weeks on both leg(s).  You may remove them at night for sleeping.           DISCHARGE MEDICATIONS:     Medication List    STOP taking these medications        aspirin 81 MG tablet      TAKE these medications        BIOFLEX PO  Take 1 tablet by mouth daily.     celecoxib 200 MG capsule  Commonly known as:  CELEBREX  Take 1 capsule (200 mg total) by mouth every 12 (twelve) hours.     enoxaparin 40 MG/0.4ML injection  Commonly known as:  LOVENOX  Inject 0.4 mLs (40 mg total) into the skin daily.     methocarbamol 500 MG tablet  Commonly known as:  ROBAXIN  Take 1-2 tablets (500-1,000 mg total) by mouth every 6 (six) hours as needed for muscle spasms.  multivitamin with minerals tablet  Take 1 tablet by mouth daily.     PRESERVISION AREDS PO  Take 1 capsule by mouth daily.     oxyCODONE 5 MG  immediate release tablet  Commonly known as:  Oxy IR/ROXICODONE  Take 1-2 tablets (5-10 mg total) by mouth every 3 (three) hours as needed for breakthrough pain.     OxyCODONE 10 mg T12a 12 hr tablet  Commonly known as:  OXYCONTIN  Take 1 tablet (10 mg total) by mouth every 12 (twelve) hours.     tamsulosin 0.4 MG Caps capsule  Commonly known as:  FLOMAX  Take 1 capsule (0.4 mg total) by mouth daily.        FOLLOW UP VISIT:       Follow-up Information    Follow up with [facility].   Why:  They will contact you to schedule home physical therapy visits.   Contact information:   [address] [place] [address] [phone]       Follow up with [facility].   Why:  They will contact you to schedule home nurse and physical therapy visits.   Contact information:   [address] [place] [address] [phone]       Follow up with Rudean Haskell, MD. Call on 06/19/2014.   Specialty:  Orthopedic Surgery   Contact information:   [place] [address] [phone]       Follow up with [place] [place]. Schedule an appointment as soon as possible for a visit in 1 week.   Contact information:   [place] [phone]      DISPOSITION: HOME  CONDITION:  Good   Sherrika Weakland 06/13/2014, 2:25 PM

## 2014-06-14 DIAGNOSIS — Z96651 Presence of right artificial knee joint: Secondary | ICD-10-CM | POA: Diagnosis not present

## 2014-06-14 DIAGNOSIS — Z471 Aftercare following joint replacement surgery: Secondary | ICD-10-CM | POA: Diagnosis not present

## 2014-06-14 DIAGNOSIS — Z7901 Long term (current) use of anticoagulants: Secondary | ICD-10-CM | POA: Diagnosis not present

## 2014-06-14 DIAGNOSIS — R339 Retention of urine, unspecified: Secondary | ICD-10-CM | POA: Diagnosis not present

## 2014-06-14 DIAGNOSIS — Z960 Presence of urogenital implants: Secondary | ICD-10-CM | POA: Diagnosis not present

## 2014-06-14 DIAGNOSIS — Z87891 Personal history of nicotine dependence: Secondary | ICD-10-CM | POA: Diagnosis not present

## 2014-06-15 DIAGNOSIS — Z960 Presence of urogenital implants: Secondary | ICD-10-CM | POA: Diagnosis not present

## 2014-06-15 DIAGNOSIS — Z7901 Long term (current) use of anticoagulants: Secondary | ICD-10-CM | POA: Diagnosis not present

## 2014-06-15 DIAGNOSIS — Z96651 Presence of right artificial knee joint: Secondary | ICD-10-CM | POA: Diagnosis not present

## 2014-06-15 DIAGNOSIS — N390 Urinary tract infection, site not specified: Secondary | ICD-10-CM | POA: Diagnosis not present

## 2014-06-15 DIAGNOSIS — Z87891 Personal history of nicotine dependence: Secondary | ICD-10-CM | POA: Diagnosis not present

## 2014-06-15 DIAGNOSIS — R339 Retention of urine, unspecified: Secondary | ICD-10-CM | POA: Diagnosis not present

## 2014-06-15 DIAGNOSIS — Z471 Aftercare following joint replacement surgery: Secondary | ICD-10-CM | POA: Diagnosis not present

## 2014-06-18 DIAGNOSIS — R339 Retention of urine, unspecified: Secondary | ICD-10-CM | POA: Diagnosis not present

## 2014-06-18 DIAGNOSIS — Z7901 Long term (current) use of anticoagulants: Secondary | ICD-10-CM | POA: Diagnosis not present

## 2014-06-18 DIAGNOSIS — Z96651 Presence of right artificial knee joint: Secondary | ICD-10-CM | POA: Diagnosis not present

## 2014-06-18 DIAGNOSIS — Z471 Aftercare following joint replacement surgery: Secondary | ICD-10-CM | POA: Diagnosis not present

## 2014-06-18 DIAGNOSIS — M25561 Pain in right knee: Secondary | ICD-10-CM | POA: Diagnosis not present

## 2014-06-18 DIAGNOSIS — Z87891 Personal history of nicotine dependence: Secondary | ICD-10-CM | POA: Diagnosis not present

## 2014-06-18 DIAGNOSIS — Z960 Presence of urogenital implants: Secondary | ICD-10-CM | POA: Diagnosis not present

## 2014-06-19 DIAGNOSIS — Z96651 Presence of right artificial knee joint: Secondary | ICD-10-CM | POA: Diagnosis not present

## 2014-06-19 DIAGNOSIS — M25461 Effusion, right knee: Secondary | ICD-10-CM | POA: Diagnosis not present

## 2014-06-19 DIAGNOSIS — M62551 Muscle wasting and atrophy, not elsewhere classified, right thigh: Secondary | ICD-10-CM | POA: Diagnosis not present

## 2014-06-19 DIAGNOSIS — M25561 Pain in right knee: Secondary | ICD-10-CM | POA: Diagnosis not present

## 2014-06-20 DIAGNOSIS — Z96651 Presence of right artificial knee joint: Secondary | ICD-10-CM | POA: Diagnosis not present

## 2014-06-20 DIAGNOSIS — M25561 Pain in right knee: Secondary | ICD-10-CM | POA: Diagnosis not present

## 2014-06-20 DIAGNOSIS — M62551 Muscle wasting and atrophy, not elsewhere classified, right thigh: Secondary | ICD-10-CM | POA: Diagnosis not present

## 2014-06-20 DIAGNOSIS — R338 Other retention of urine: Secondary | ICD-10-CM | POA: Diagnosis not present

## 2014-06-20 DIAGNOSIS — M25461 Effusion, right knee: Secondary | ICD-10-CM | POA: Diagnosis not present

## 2014-06-21 DIAGNOSIS — Z96651 Presence of right artificial knee joint: Secondary | ICD-10-CM | POA: Diagnosis not present

## 2014-06-21 DIAGNOSIS — R351 Nocturia: Secondary | ICD-10-CM | POA: Diagnosis not present

## 2014-06-21 DIAGNOSIS — R3915 Urgency of urination: Secondary | ICD-10-CM | POA: Diagnosis not present

## 2014-06-21 DIAGNOSIS — M25461 Effusion, right knee: Secondary | ICD-10-CM | POA: Diagnosis not present

## 2014-06-21 DIAGNOSIS — M62551 Muscle wasting and atrophy, not elsewhere classified, right thigh: Secondary | ICD-10-CM | POA: Diagnosis not present

## 2014-06-21 DIAGNOSIS — N390 Urinary tract infection, site not specified: Secondary | ICD-10-CM | POA: Diagnosis not present

## 2014-06-21 DIAGNOSIS — M25561 Pain in right knee: Secondary | ICD-10-CM | POA: Diagnosis not present

## 2014-06-21 DIAGNOSIS — N401 Enlarged prostate with lower urinary tract symptoms: Secondary | ICD-10-CM | POA: Diagnosis not present

## 2014-06-22 DIAGNOSIS — M25461 Effusion, right knee: Secondary | ICD-10-CM | POA: Diagnosis not present

## 2014-06-22 DIAGNOSIS — M25561 Pain in right knee: Secondary | ICD-10-CM | POA: Diagnosis not present

## 2014-06-22 DIAGNOSIS — M62551 Muscle wasting and atrophy, not elsewhere classified, right thigh: Secondary | ICD-10-CM | POA: Diagnosis not present

## 2014-06-22 DIAGNOSIS — Z96651 Presence of right artificial knee joint: Secondary | ICD-10-CM | POA: Diagnosis not present

## 2014-06-25 DIAGNOSIS — M25561 Pain in right knee: Secondary | ICD-10-CM | POA: Diagnosis not present

## 2014-06-25 DIAGNOSIS — Z96651 Presence of right artificial knee joint: Secondary | ICD-10-CM | POA: Diagnosis not present

## 2014-06-25 DIAGNOSIS — M25461 Effusion, right knee: Secondary | ICD-10-CM | POA: Diagnosis not present

## 2014-06-25 DIAGNOSIS — M62551 Muscle wasting and atrophy, not elsewhere classified, right thigh: Secondary | ICD-10-CM | POA: Diagnosis not present

## 2014-06-26 DIAGNOSIS — M62551 Muscle wasting and atrophy, not elsewhere classified, right thigh: Secondary | ICD-10-CM | POA: Diagnosis not present

## 2014-06-26 DIAGNOSIS — Z96651 Presence of right artificial knee joint: Secondary | ICD-10-CM | POA: Diagnosis not present

## 2014-06-26 DIAGNOSIS — M25561 Pain in right knee: Secondary | ICD-10-CM | POA: Diagnosis not present

## 2014-06-26 DIAGNOSIS — M25461 Effusion, right knee: Secondary | ICD-10-CM | POA: Diagnosis not present

## 2014-06-27 DIAGNOSIS — M25461 Effusion, right knee: Secondary | ICD-10-CM | POA: Diagnosis not present

## 2014-06-27 DIAGNOSIS — Z96651 Presence of right artificial knee joint: Secondary | ICD-10-CM | POA: Diagnosis not present

## 2014-06-27 DIAGNOSIS — M62551 Muscle wasting and atrophy, not elsewhere classified, right thigh: Secondary | ICD-10-CM | POA: Diagnosis not present

## 2014-06-27 DIAGNOSIS — M25561 Pain in right knee: Secondary | ICD-10-CM | POA: Diagnosis not present

## 2014-06-28 DIAGNOSIS — M62551 Muscle wasting and atrophy, not elsewhere classified, right thigh: Secondary | ICD-10-CM | POA: Diagnosis not present

## 2014-06-28 DIAGNOSIS — M25461 Effusion, right knee: Secondary | ICD-10-CM | POA: Diagnosis not present

## 2014-06-28 DIAGNOSIS — M25561 Pain in right knee: Secondary | ICD-10-CM | POA: Diagnosis not present

## 2014-06-28 DIAGNOSIS — Z96651 Presence of right artificial knee joint: Secondary | ICD-10-CM | POA: Diagnosis not present

## 2014-06-29 DIAGNOSIS — Z96651 Presence of right artificial knee joint: Secondary | ICD-10-CM | POA: Diagnosis not present

## 2014-06-29 DIAGNOSIS — M62551 Muscle wasting and atrophy, not elsewhere classified, right thigh: Secondary | ICD-10-CM | POA: Diagnosis not present

## 2014-06-29 DIAGNOSIS — M25561 Pain in right knee: Secondary | ICD-10-CM | POA: Diagnosis not present

## 2014-06-29 DIAGNOSIS — M25461 Effusion, right knee: Secondary | ICD-10-CM | POA: Diagnosis not present

## 2014-07-02 DIAGNOSIS — M25461 Effusion, right knee: Secondary | ICD-10-CM | POA: Diagnosis not present

## 2014-07-02 DIAGNOSIS — Z96651 Presence of right artificial knee joint: Secondary | ICD-10-CM | POA: Diagnosis not present

## 2014-07-02 DIAGNOSIS — M62551 Muscle wasting and atrophy, not elsewhere classified, right thigh: Secondary | ICD-10-CM | POA: Diagnosis not present

## 2014-07-02 DIAGNOSIS — M25561 Pain in right knee: Secondary | ICD-10-CM | POA: Diagnosis not present

## 2014-07-03 DIAGNOSIS — M62551 Muscle wasting and atrophy, not elsewhere classified, right thigh: Secondary | ICD-10-CM | POA: Diagnosis not present

## 2014-07-03 DIAGNOSIS — M25461 Effusion, right knee: Secondary | ICD-10-CM | POA: Diagnosis not present

## 2014-07-03 DIAGNOSIS — Z96651 Presence of right artificial knee joint: Secondary | ICD-10-CM | POA: Diagnosis not present

## 2014-07-03 DIAGNOSIS — M25561 Pain in right knee: Secondary | ICD-10-CM | POA: Diagnosis not present

## 2014-07-05 DIAGNOSIS — M25561 Pain in right knee: Secondary | ICD-10-CM | POA: Diagnosis not present

## 2014-07-05 DIAGNOSIS — Z96651 Presence of right artificial knee joint: Secondary | ICD-10-CM | POA: Diagnosis not present

## 2014-07-05 DIAGNOSIS — M62551 Muscle wasting and atrophy, not elsewhere classified, right thigh: Secondary | ICD-10-CM | POA: Diagnosis not present

## 2014-07-05 DIAGNOSIS — M25461 Effusion, right knee: Secondary | ICD-10-CM | POA: Diagnosis not present

## 2014-07-09 DIAGNOSIS — M25461 Effusion, right knee: Secondary | ICD-10-CM | POA: Diagnosis not present

## 2014-07-09 DIAGNOSIS — M25561 Pain in right knee: Secondary | ICD-10-CM | POA: Diagnosis not present

## 2014-07-09 DIAGNOSIS — Z96651 Presence of right artificial knee joint: Secondary | ICD-10-CM | POA: Diagnosis not present

## 2014-07-09 DIAGNOSIS — M62551 Muscle wasting and atrophy, not elsewhere classified, right thigh: Secondary | ICD-10-CM | POA: Diagnosis not present

## 2014-07-11 DIAGNOSIS — M62551 Muscle wasting and atrophy, not elsewhere classified, right thigh: Secondary | ICD-10-CM | POA: Diagnosis not present

## 2014-07-11 DIAGNOSIS — M25561 Pain in right knee: Secondary | ICD-10-CM | POA: Diagnosis not present

## 2014-07-11 DIAGNOSIS — M25461 Effusion, right knee: Secondary | ICD-10-CM | POA: Diagnosis not present

## 2014-07-11 DIAGNOSIS — Z96651 Presence of right artificial knee joint: Secondary | ICD-10-CM | POA: Diagnosis not present

## 2014-07-13 DIAGNOSIS — M25561 Pain in right knee: Secondary | ICD-10-CM | POA: Diagnosis not present

## 2014-07-13 DIAGNOSIS — M62551 Muscle wasting and atrophy, not elsewhere classified, right thigh: Secondary | ICD-10-CM | POA: Diagnosis not present

## 2014-07-13 DIAGNOSIS — Z96651 Presence of right artificial knee joint: Secondary | ICD-10-CM | POA: Diagnosis not present

## 2014-07-13 DIAGNOSIS — M25461 Effusion, right knee: Secondary | ICD-10-CM | POA: Diagnosis not present

## 2014-07-16 DIAGNOSIS — M25561 Pain in right knee: Secondary | ICD-10-CM | POA: Diagnosis not present

## 2014-07-16 DIAGNOSIS — Z96651 Presence of right artificial knee joint: Secondary | ICD-10-CM | POA: Diagnosis not present

## 2014-07-16 DIAGNOSIS — M62551 Muscle wasting and atrophy, not elsewhere classified, right thigh: Secondary | ICD-10-CM | POA: Diagnosis not present

## 2014-07-16 DIAGNOSIS — M25461 Effusion, right knee: Secondary | ICD-10-CM | POA: Diagnosis not present

## 2014-07-18 DIAGNOSIS — Z96651 Presence of right artificial knee joint: Secondary | ICD-10-CM | POA: Diagnosis not present

## 2014-07-18 DIAGNOSIS — M62551 Muscle wasting and atrophy, not elsewhere classified, right thigh: Secondary | ICD-10-CM | POA: Diagnosis not present

## 2014-07-18 DIAGNOSIS — M25461 Effusion, right knee: Secondary | ICD-10-CM | POA: Diagnosis not present

## 2014-07-18 DIAGNOSIS — M25561 Pain in right knee: Secondary | ICD-10-CM | POA: Diagnosis not present

## 2014-07-20 DIAGNOSIS — M25561 Pain in right knee: Secondary | ICD-10-CM | POA: Diagnosis not present

## 2014-07-20 DIAGNOSIS — Z96651 Presence of right artificial knee joint: Secondary | ICD-10-CM | POA: Diagnosis not present

## 2014-07-20 DIAGNOSIS — M62551 Muscle wasting and atrophy, not elsewhere classified, right thigh: Secondary | ICD-10-CM | POA: Diagnosis not present

## 2014-07-20 DIAGNOSIS — M25461 Effusion, right knee: Secondary | ICD-10-CM | POA: Diagnosis not present

## 2014-07-23 DIAGNOSIS — M25561 Pain in right knee: Secondary | ICD-10-CM | POA: Diagnosis not present

## 2014-07-23 DIAGNOSIS — M62551 Muscle wasting and atrophy, not elsewhere classified, right thigh: Secondary | ICD-10-CM | POA: Diagnosis not present

## 2014-07-23 DIAGNOSIS — M25461 Effusion, right knee: Secondary | ICD-10-CM | POA: Diagnosis not present

## 2014-07-23 DIAGNOSIS — Z96651 Presence of right artificial knee joint: Secondary | ICD-10-CM | POA: Diagnosis not present

## 2014-07-24 DIAGNOSIS — M25461 Effusion, right knee: Secondary | ICD-10-CM | POA: Diagnosis not present

## 2014-07-24 DIAGNOSIS — Z96651 Presence of right artificial knee joint: Secondary | ICD-10-CM | POA: Diagnosis not present

## 2014-07-24 DIAGNOSIS — M62551 Muscle wasting and atrophy, not elsewhere classified, right thigh: Secondary | ICD-10-CM | POA: Diagnosis not present

## 2014-07-24 DIAGNOSIS — M25561 Pain in right knee: Secondary | ICD-10-CM | POA: Diagnosis not present

## 2014-07-26 DIAGNOSIS — Z96651 Presence of right artificial knee joint: Secondary | ICD-10-CM | POA: Diagnosis not present

## 2014-07-26 DIAGNOSIS — M25561 Pain in right knee: Secondary | ICD-10-CM | POA: Diagnosis not present

## 2014-07-26 DIAGNOSIS — M25461 Effusion, right knee: Secondary | ICD-10-CM | POA: Diagnosis not present

## 2014-07-26 DIAGNOSIS — M62551 Muscle wasting and atrophy, not elsewhere classified, right thigh: Secondary | ICD-10-CM | POA: Diagnosis not present

## 2014-07-30 DIAGNOSIS — M62551 Muscle wasting and atrophy, not elsewhere classified, right thigh: Secondary | ICD-10-CM | POA: Diagnosis not present

## 2014-07-30 DIAGNOSIS — M25461 Effusion, right knee: Secondary | ICD-10-CM | POA: Diagnosis not present

## 2014-07-30 DIAGNOSIS — Z96651 Presence of right artificial knee joint: Secondary | ICD-10-CM | POA: Diagnosis not present

## 2014-07-30 DIAGNOSIS — M25561 Pain in right knee: Secondary | ICD-10-CM | POA: Diagnosis not present

## 2014-08-02 DIAGNOSIS — Z96651 Presence of right artificial knee joint: Secondary | ICD-10-CM | POA: Diagnosis not present

## 2014-08-02 DIAGNOSIS — M62551 Muscle wasting and atrophy, not elsewhere classified, right thigh: Secondary | ICD-10-CM | POA: Diagnosis not present

## 2014-08-02 DIAGNOSIS — M25461 Effusion, right knee: Secondary | ICD-10-CM | POA: Diagnosis not present

## 2014-08-02 DIAGNOSIS — M25561 Pain in right knee: Secondary | ICD-10-CM | POA: Diagnosis not present

## 2014-08-06 DIAGNOSIS — Z96651 Presence of right artificial knee joint: Secondary | ICD-10-CM | POA: Diagnosis not present

## 2014-08-06 DIAGNOSIS — M25561 Pain in right knee: Secondary | ICD-10-CM | POA: Diagnosis not present

## 2014-08-06 DIAGNOSIS — M25461 Effusion, right knee: Secondary | ICD-10-CM | POA: Diagnosis not present

## 2014-08-06 DIAGNOSIS — M62551 Muscle wasting and atrophy, not elsewhere classified, right thigh: Secondary | ICD-10-CM | POA: Diagnosis not present

## 2014-08-08 DIAGNOSIS — M25461 Effusion, right knee: Secondary | ICD-10-CM | POA: Diagnosis not present

## 2014-08-08 DIAGNOSIS — M25561 Pain in right knee: Secondary | ICD-10-CM | POA: Diagnosis not present

## 2014-08-08 DIAGNOSIS — Z96651 Presence of right artificial knee joint: Secondary | ICD-10-CM | POA: Diagnosis not present

## 2014-08-08 DIAGNOSIS — M62551 Muscle wasting and atrophy, not elsewhere classified, right thigh: Secondary | ICD-10-CM | POA: Diagnosis not present

## 2014-08-13 DIAGNOSIS — M25461 Effusion, right knee: Secondary | ICD-10-CM | POA: Diagnosis not present

## 2014-08-13 DIAGNOSIS — M62551 Muscle wasting and atrophy, not elsewhere classified, right thigh: Secondary | ICD-10-CM | POA: Diagnosis not present

## 2014-08-13 DIAGNOSIS — M25561 Pain in right knee: Secondary | ICD-10-CM | POA: Diagnosis not present

## 2014-08-13 DIAGNOSIS — Z96651 Presence of right artificial knee joint: Secondary | ICD-10-CM | POA: Diagnosis not present

## 2014-08-16 DIAGNOSIS — Z96651 Presence of right artificial knee joint: Secondary | ICD-10-CM | POA: Diagnosis not present

## 2014-08-16 DIAGNOSIS — M25461 Effusion, right knee: Secondary | ICD-10-CM | POA: Diagnosis not present

## 2014-08-16 DIAGNOSIS — M25561 Pain in right knee: Secondary | ICD-10-CM | POA: Diagnosis not present

## 2014-08-16 DIAGNOSIS — M62551 Muscle wasting and atrophy, not elsewhere classified, right thigh: Secondary | ICD-10-CM | POA: Diagnosis not present

## 2014-08-20 DIAGNOSIS — M62551 Muscle wasting and atrophy, not elsewhere classified, right thigh: Secondary | ICD-10-CM | POA: Diagnosis not present

## 2014-08-20 DIAGNOSIS — Z96651 Presence of right artificial knee joint: Secondary | ICD-10-CM | POA: Diagnosis not present

## 2014-08-20 DIAGNOSIS — M25461 Effusion, right knee: Secondary | ICD-10-CM | POA: Diagnosis not present

## 2014-08-20 DIAGNOSIS — M25561 Pain in right knee: Secondary | ICD-10-CM | POA: Diagnosis not present

## 2014-10-16 DIAGNOSIS — R351 Nocturia: Secondary | ICD-10-CM | POA: Diagnosis not present

## 2014-10-16 DIAGNOSIS — N401 Enlarged prostate with lower urinary tract symptoms: Secondary | ICD-10-CM | POA: Diagnosis not present

## 2014-11-22 DIAGNOSIS — Z96651 Presence of right artificial knee joint: Secondary | ICD-10-CM | POA: Diagnosis not present

## 2015-01-14 IMAGING — CR DG CHEST 2V
2 series · 2 of 2 positions shown · non-contrast
Comparison: None.

CLINICAL DATA: Preoperative exam prior to total knee arthroplasty.

EXAM:
CHEST  2 VIEW

[w chest pa]
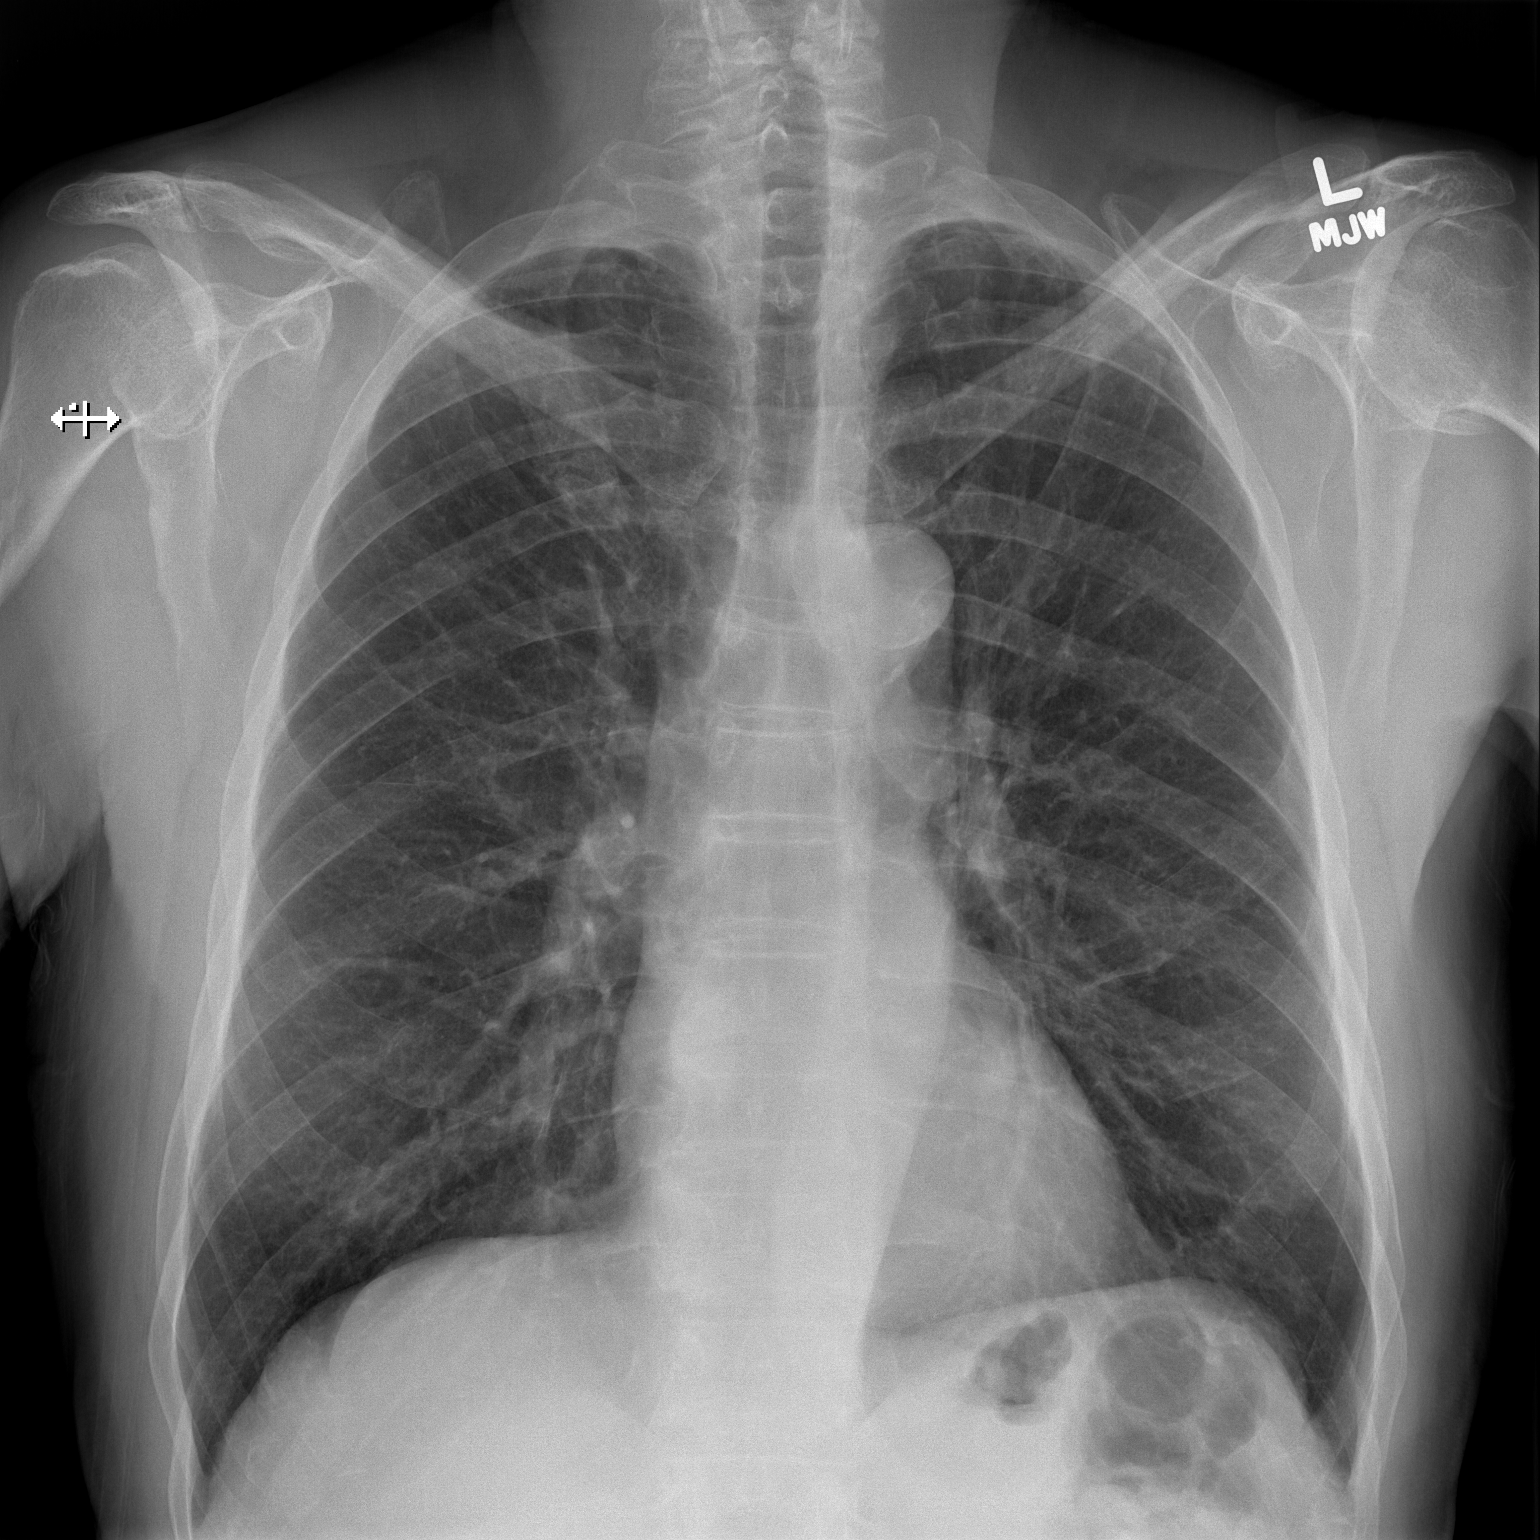

[w chest lat]
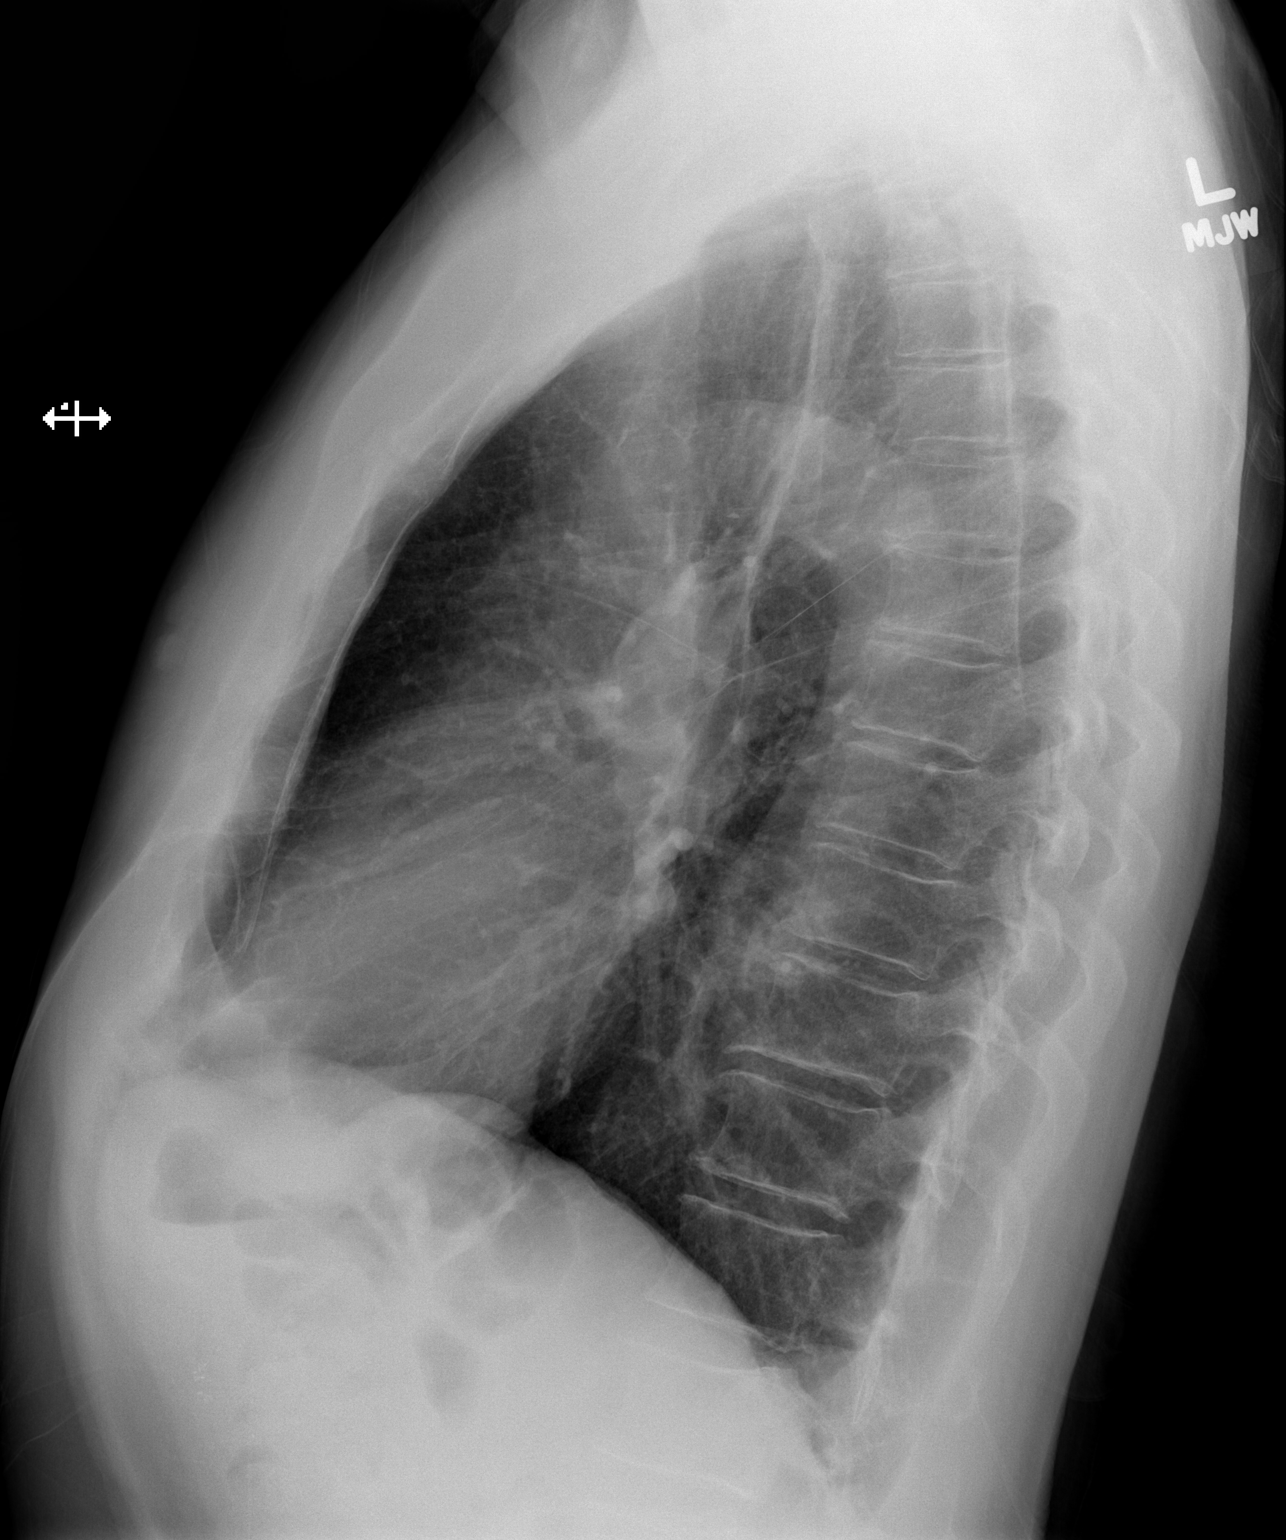

[2 of 2 positions shown; findings below may reference images not displayed]

FINDINGS: The lungs are mildly hyperinflated and clear. The heart and
pulmonary vascularity are normal. The mediastinum is normal in
width. There is no pleural effusion or pneumothorax.
IMPRESSION: Mild hyperinflation may be voluntary or could reflect underlying
COPD or reactive airway disease. There is no active cardiopulmonary
disease.

## 2015-02-20 DIAGNOSIS — K5730 Diverticulosis of large intestine without perforation or abscess without bleeding: Secondary | ICD-10-CM | POA: Diagnosis not present

## 2015-03-15 DIAGNOSIS — Z1211 Encounter for screening for malignant neoplasm of colon: Secondary | ICD-10-CM | POA: Diagnosis not present

## 2015-03-15 DIAGNOSIS — Z86010 Personal history of colonic polyps: Secondary | ICD-10-CM | POA: Diagnosis not present

## 2015-03-15 DIAGNOSIS — Z800 Family history of malignant neoplasm of digestive organs: Secondary | ICD-10-CM | POA: Diagnosis not present

## 2015-03-15 DIAGNOSIS — K5730 Diverticulosis of large intestine without perforation or abscess without bleeding: Secondary | ICD-10-CM | POA: Diagnosis not present

## 2015-03-15 DIAGNOSIS — Z96651 Presence of right artificial knee joint: Secondary | ICD-10-CM | POA: Diagnosis not present

## 2015-05-30 DIAGNOSIS — M25461 Effusion, right knee: Secondary | ICD-10-CM | POA: Diagnosis not present

## 2015-05-30 DIAGNOSIS — Z96651 Presence of right artificial knee joint: Secondary | ICD-10-CM | POA: Diagnosis not present

## 2015-05-30 DIAGNOSIS — Z4789 Encounter for other orthopedic aftercare: Secondary | ICD-10-CM | POA: Diagnosis not present

## 2015-08-20 DIAGNOSIS — H25811 Combined forms of age-related cataract, right eye: Secondary | ICD-10-CM | POA: Diagnosis not present

## 2015-08-20 DIAGNOSIS — H353131 Nonexudative age-related macular degeneration, bilateral, early dry stage: Secondary | ICD-10-CM | POA: Diagnosis not present

## 2015-09-10 DIAGNOSIS — R030 Elevated blood-pressure reading, without diagnosis of hypertension: Secondary | ICD-10-CM | POA: Diagnosis not present

## 2015-09-10 DIAGNOSIS — H25811 Combined forms of age-related cataract, right eye: Secondary | ICD-10-CM | POA: Diagnosis not present

## 2015-09-10 DIAGNOSIS — Z7982 Long term (current) use of aspirin: Secondary | ICD-10-CM | POA: Diagnosis not present

## 2015-10-08 DIAGNOSIS — H40013 Open angle with borderline findings, low risk, bilateral: Secondary | ICD-10-CM | POA: Diagnosis not present

## 2015-10-16 DIAGNOSIS — H2512 Age-related nuclear cataract, left eye: Secondary | ICD-10-CM | POA: Diagnosis not present

## 2015-10-16 DIAGNOSIS — H25812 Combined forms of age-related cataract, left eye: Secondary | ICD-10-CM | POA: Diagnosis not present

## 2016-01-17 ENCOUNTER — Other Ambulatory Visit: Payer: Self-pay

## 2016-01-21 DIAGNOSIS — Z79899 Other long term (current) drug therapy: Secondary | ICD-10-CM | POA: Diagnosis not present

## 2016-01-21 DIAGNOSIS — Z9181 History of falling: Secondary | ICD-10-CM | POA: Diagnosis not present

## 2016-01-21 DIAGNOSIS — G8929 Other chronic pain: Secondary | ICD-10-CM | POA: Diagnosis not present

## 2016-01-21 DIAGNOSIS — R5383 Other fatigue: Secondary | ICD-10-CM | POA: Diagnosis not present

## 2016-01-21 DIAGNOSIS — Z0000 Encounter for general adult medical examination without abnormal findings: Secondary | ICD-10-CM | POA: Diagnosis not present

## 2016-01-21 DIAGNOSIS — Z1389 Encounter for screening for other disorder: Secondary | ICD-10-CM | POA: Diagnosis not present

## 2016-01-21 DIAGNOSIS — D519 Vitamin B12 deficiency anemia, unspecified: Secondary | ICD-10-CM | POA: Diagnosis not present

## 2016-01-30 DIAGNOSIS — R5383 Other fatigue: Secondary | ICD-10-CM | POA: Diagnosis not present

## 2016-09-22 DIAGNOSIS — Z6823 Body mass index (BMI) 23.0-23.9, adult: Secondary | ICD-10-CM | POA: Diagnosis not present

## 2016-09-22 DIAGNOSIS — X32XXXA Exposure to sunlight, initial encounter: Secondary | ICD-10-CM | POA: Diagnosis not present

## 2016-09-22 DIAGNOSIS — L570 Actinic keratosis: Secondary | ICD-10-CM | POA: Diagnosis not present

## 2016-11-12 DIAGNOSIS — H47093 Other disorders of optic nerve, not elsewhere classified, bilateral: Secondary | ICD-10-CM | POA: Diagnosis not present

## 2016-11-12 DIAGNOSIS — Z961 Presence of intraocular lens: Secondary | ICD-10-CM | POA: Diagnosis not present

## 2017-09-21 DIAGNOSIS — Z1331 Encounter for screening for depression: Secondary | ICD-10-CM | POA: Diagnosis not present

## 2017-09-21 DIAGNOSIS — N400 Enlarged prostate without lower urinary tract symptoms: Secondary | ICD-10-CM | POA: Diagnosis not present

## 2017-09-21 DIAGNOSIS — Z9181 History of falling: Secondary | ICD-10-CM | POA: Diagnosis not present

## 2017-09-21 DIAGNOSIS — R5383 Other fatigue: Secondary | ICD-10-CM | POA: Diagnosis not present

## 2017-09-21 DIAGNOSIS — Z79899 Other long term (current) drug therapy: Secondary | ICD-10-CM | POA: Diagnosis not present

## 2017-09-21 DIAGNOSIS — G8929 Other chronic pain: Secondary | ICD-10-CM | POA: Diagnosis not present

## 2017-09-21 DIAGNOSIS — Z0000 Encounter for general adult medical examination without abnormal findings: Secondary | ICD-10-CM | POA: Diagnosis not present

## 2017-09-21 DIAGNOSIS — R030 Elevated blood-pressure reading, without diagnosis of hypertension: Secondary | ICD-10-CM | POA: Diagnosis not present

## 2017-11-04 DIAGNOSIS — M25562 Pain in left knee: Secondary | ICD-10-CM | POA: Diagnosis not present

## 2017-11-04 DIAGNOSIS — G8929 Other chronic pain: Secondary | ICD-10-CM | POA: Diagnosis not present

## 2017-11-04 DIAGNOSIS — M1712 Unilateral primary osteoarthritis, left knee: Secondary | ICD-10-CM | POA: Diagnosis not present

## 2017-11-10 DIAGNOSIS — Z1331 Encounter for screening for depression: Secondary | ICD-10-CM | POA: Diagnosis not present

## 2017-11-10 DIAGNOSIS — Z1339 Encounter for screening examination for other mental health and behavioral disorders: Secondary | ICD-10-CM | POA: Diagnosis not present

## 2017-11-10 DIAGNOSIS — Z6822 Body mass index (BMI) 22.0-22.9, adult: Secondary | ICD-10-CM | POA: Diagnosis not present

## 2017-11-10 DIAGNOSIS — I10 Essential (primary) hypertension: Secondary | ICD-10-CM | POA: Diagnosis not present

## 2017-12-07 DIAGNOSIS — R350 Frequency of micturition: Secondary | ICD-10-CM | POA: Diagnosis not present

## 2017-12-07 DIAGNOSIS — R351 Nocturia: Secondary | ICD-10-CM | POA: Diagnosis not present

## 2017-12-13 ENCOUNTER — Other Ambulatory Visit: Payer: Self-pay | Admitting: Orthopedic Surgery

## 2018-01-18 DIAGNOSIS — N401 Enlarged prostate with lower urinary tract symptoms: Secondary | ICD-10-CM | POA: Diagnosis not present

## 2018-01-18 DIAGNOSIS — R338 Other retention of urine: Secondary | ICD-10-CM | POA: Diagnosis not present

## 2018-01-24 ENCOUNTER — Encounter (HOSPITAL_COMMUNITY): Payer: Self-pay

## 2018-01-24 NOTE — Patient Instructions (Signed)
Portal  DOB  [birth year]    Your procedure is scheduled on:  02-07-2018   Report to [facility] Main  Entrance,  Report to admitting at  5:30 AM    Call this number if you have problems the morning of surgery [phone]                Remember: Do not eat food or drink liquids :After Midnight.     Take these medicines the morning of surgery with A SIP OF WATER:  AMLODIPINE, FINASTERIDE                                You may not have any metal on your body including hair pins and              piercings                          Do not wear jewelry, make-up, lotions, powders or perfumes, deodorant                         Men may shave face and neck.   Do not bring valuables to the hospital. [place].  Contacts, dentures or bridgework may not be worn into surgery.  Leave suitcase in the car. After surgery it may be brought to your room.    Special Instructions: N/A              Please read over the following fact sheets you were given:    ___________________________________Incentive Spirometer  An incentive spirometer is a tool that can help keep your lungs clear and active. This tool measures how well you are filling your lungs with each breath. Taking long deep breaths may help reverse or decrease the chance of developing breathing (pulmonary) problems (especially infection) following:  A long period of time when you are unable to move or be active. BEFORE THE PROCEDURE   If the spirometer includes an indicator to show your best effort, your nurse or respiratory therapist will set it to a desired goal.  If possible, sit up straight or lean slightly forward. Try not to slouch.  Hold the incentive spirometer in an upright position. INSTRUCTIONS FOR USE  1. Sit on the edge of your bed if possible, or sit up as far as you can in bed or on a chair. 2. Hold the incentive spirometer in an  upright position. 3. Breathe out normally. 4. Place the mouthpiece in your mouth and seal your lips tightly around it. 5. Breathe in slowly and as deeply as possible, raising the piston or the ball toward the top of the column. 6. Hold your breath for 3-5 seconds or for as long as possible. Allow the piston or ball to fall to the bottom of the column. 7. Remove the mouthpiece from your mouth and breathe out normally. 8. Rest for a few seconds and repeat Steps 1 through 7 at least 10 times every 1-2 hours when you are awake. Take your time and take a few normal breaths between deep breaths. 9. The spirometer may include an indicator to show your best effort. Use the indicator as  a goal to work toward during each repetition. 10. After each set of 10 deep breaths, practice coughing to be sure your lungs are clear. If you have an incision (the cut made at the time of surgery), support your incision when coughing by placing a pillow or rolled up towels firmly against it. Once you are able to get out of bed, walk around indoors and cough well. You may stop using the incentive spirometer when instructed by your caregiver.  RISKS AND COMPLICATIONS  Take your time so you do not get dizzy or light-headed.  If you are in pain, you may need to take or ask for pain medication before doing incentive spirometry. It is harder to take a deep breath if you are having pain. AFTER USE  Rest and breathe slowly and easily.  It can be helpful to keep track of a log of your progress. Your caregiver can provide you with a simple table to help with this. If you are using the spirometer at home, follow these instructions: [place] IF:   You are having difficultly using the spirometer.  You have trouble using the spirometer as often as instructed.  Your pain medication is not giving enough relief while using the spirometer.  You develop fever of 100.5 F (38.1 C) or higher. SEEK IMMEDIATE MEDICAL CARE  IF:   You cough up bloody sputum that had not been present before.  You develop fever of 102 F (38.9 C) or greater.  You develop worsening pain at or near the incision site. MAKE SURE YOU:   Understand these instructions.  Will watch your condition.  Will get help right away if you are not doing well or get worse. Document Released: 09/14/2006 Document Revised: 07/27/2011 Document Reviewed: 11/15/2006 ExitCare Patient Information 2014 [employer].   ________________________________________________________________________ [facility] - Preparing for Surgery Before surgery, you can play an important role.  Because skin is not sterile, your skin needs to be as free of germs as possible.  You can reduce the number of germs on your skin by washing with CHG (chlorahexidine gluconate) soap before surgery.  CHG is an antiseptic cleaner which kills germs and bonds with the skin to continue killing germs even after washing. Please DO NOT use if you have an allergy to CHG or antibacterial soaps.  If your skin becomes reddened/irritated stop using the CHG and inform your nurse when you arrive at Short Stay. Do not shave (including legs and underarms) for at least 48 hours prior to the first CHG shower.  You may shave your face/neck. Please follow these instructions carefully:  1.  Shower with CHG Soap the night before surgery and the  morning of Surgery.  2.  If you choose to wash your hair, wash your hair first as usual with your  normal  shampoo.  3.  After you shampoo, rinse your hair and body thoroughly to remove the  shampoo.                                      4.  Use CHG as you would any other liquid soap.  You can apply chg directly  to the skin and wash                       Gently with a scrungie or clean washcloth.  5.  Apply the CHG Soap to  your body ONLY FROM THE NECK DOWN.   Do not use on face/ open                           Wound or open sores. Avoid contact with eyes, ears  mouth and genitals (private parts).                       Wash face,  Genitals (private parts) with your normal soap.             6.  Wash thoroughly, paying special attention to the area where your surgery  will be performed.  7.  Thoroughly rinse your body with warm water from the neck down.  8.  DO NOT shower/wash with your normal soap after using and rinsing off  the CHG Soap.             9.  Pat yourself dry with a clean towel.            10.  Wear clean pajamas.            11.  Place clean sheets on your bed the night of your first shower and do not  sleep with pets. Day of Surgery : Do not apply any lotions/deodorants the morning of surgery.  Please wear clean clothes to the hospital/surgery center.  FAILURE TO FOLLOW THESE INSTRUCTIONS MAY RESULT IN THE CANCELLATION OF YOUR SURGERY PATIENT SIGNATURE_________________________________  NURSE SIGNATURE__________________________________  ________________________________________________________________________

## 2018-01-25 ENCOUNTER — Other Ambulatory Visit: Payer: Self-pay

## 2018-01-25 ENCOUNTER — Encounter (HOSPITAL_COMMUNITY)
Admission: RE | Admit: 2018-01-25 | Discharge: 2018-01-25 | Disposition: A | Discharge status: Home / Self Care | Payer: Medicare Other | Source: Ambulatory Visit | Attending: Orthopedic Surgery | Admitting: Orthopedic Surgery

## 2018-01-25 ENCOUNTER — Encounter (HOSPITAL_COMMUNITY): Payer: Self-pay

## 2018-01-25 DIAGNOSIS — Z01818 Encounter for other preprocedural examination: Secondary | ICD-10-CM | POA: Insufficient documentation

## 2018-01-25 DIAGNOSIS — R9431 Abnormal electrocardiogram [ECG] [EKG]: Secondary | ICD-10-CM | POA: Diagnosis not present

## 2018-01-25 DIAGNOSIS — M1712 Unilateral primary osteoarthritis, left knee: Secondary | ICD-10-CM | POA: Diagnosis not present

## 2018-01-25 HISTORY — DX: Benign prostatic hyperplasia without lower urinary tract symptoms: N40.0

## 2018-01-25 HISTORY — DX: Presence of spectacles and contact lenses: Z97.3

## 2018-01-25 HISTORY — DX: Essential (primary) hypertension: I10

## 2018-01-25 HISTORY — DX: Nocturia: R35.1

## 2018-01-25 HISTORY — DX: Personal history of other specified conditions: Z87.898

## 2018-01-25 HISTORY — DX: Unspecified osteoarthritis, unspecified site: M19.90

## 2018-01-25 LAB — COMPREHENSIVE METABOLIC PANEL
ALT: 15 U/L (ref 0–44)
AST: 21 U/L (ref 15–41)
Albumin: 3.7 g/dL (ref 3.5–5.0)
Alkaline Phosphatase: 74 U/L (ref 38–126)
Anion gap: 5 (ref 5–15)
BUN: 20 mg/dL (ref 8–23)
CHLORIDE: 105 mmol/L (ref 98–111)
CO2: 29 mmol/L (ref 22–32)
CREATININE: 0.96 mg/dL (ref 0.61–1.24)
Calcium: 8.8 mg/dL — ABNORMAL LOW (ref 8.9–10.3)
GFR calc Af Amer: >60 mL/min (ref >60)
GFR calc non Af Amer: >60 mL/min (ref >60)
Glucose, Bld: 96 mg/dL (ref 70–99)
POTASSIUM: 4.4 mmol/L (ref 3.5–5.1)
SODIUM: 139 mmol/L (ref 135–145)
Total Bilirubin: 0.4 mg/dL (ref 0.3–1.2)
Total Protein: 7.5 g/dL (ref 6.5–8.1)

## 2018-01-25 LAB — CBC WITH DIFFERENTIAL/PLATELET
BASOS ABS: 0 10*3/uL (ref 0.0–0.1)
Basophils Relative: 1 %
EOS ABS: 0.1 10*3/uL (ref 0.0–0.7)
EOS PCT: 1 %
HCT: 41.8 % (ref 39.0–52.0)
Hemoglobin: 13.7 g/dL (ref 13.0–17.0)
Lymphocytes Relative: 27 %
Lymphs Abs: 1.3 10*3/uL (ref 0.7–4.0)
MCH: 29.8 pg (ref 26.0–34.0)
MCHC: 32.8 g/dL (ref 30.0–36.0)
MCV: 90.9 fL (ref 78.0–100.0)
MONO ABS: 0.6 10*3/uL (ref 0.1–1.0)
Monocytes Relative: 13 %
Neutro Abs: 2.7 10*3/uL (ref 1.7–7.7)
Neutrophils Relative %: 58 %
PLATELETS: 260 10*3/uL (ref 150–400)
RBC: 4.6 MIL/uL (ref 4.22–5.81)
RDW: 13.6 % (ref 11.5–15.5)
WBC: 4.7 10*3/uL (ref 4.0–10.5)

## 2018-01-25 LAB — SURGICAL PCR SCREEN
MRSA, PCR: NEGATIVE
Staphylococcus aureus: NEGATIVE

## 2018-01-27 NOTE — Progress Notes (Signed)
Final EKG dated 01-26-2015 in epic.

## 2018-02-06 MED ORDER — BUPIVACAINE LIPOSOME 1.3 % IJ SUSP
20.0000 mL | INTRAMUSCULAR | Status: DC
  Filled 2018-02-06: qty 20

## 2018-02-06 NOTE — Anesthesia Preprocedure Evaluation (Addendum)
Anesthesia Evaluation  Patient identified by MRN, date of birth, ID band Patient awake    Reviewed: Allergy & Precautions, H&P , NPO status , Patient's Chart, lab work & pertinent test results, reviewed documented beta blocker date and time   History of Anesthesia Complications (+) PONV and history of anesthetic complications  Airway Mallampati: II  TM Distance: >3 FB Neck ROM: full    Dental no notable dental hx.    Pulmonary neg pulmonary ROS, former smoker,    Pulmonary exam normal breath sounds clear to auscultation       Cardiovascular Exercise Tolerance: Good hypertension, Pt. on medications negative cardio ROS   Rhythm:regular Rate:Normal  EKG 9/19 Normal sinus rhythm Left anterior fasicular block   Neuro/Psych negative neurological ROS  negative psych ROS   GI/Hepatic negative GI ROS, Neg liver ROS,   Endo/Other  negative endocrine ROS  Renal/GU negative Renal ROS  negative genitourinary   Musculoskeletal  (+) Arthritis , Osteoarthritis,    Abdominal   Peds  Hematology negative hematology ROS (+)   Anesthesia Other Findings   Reproductive/Obstetrics negative OB ROS                             Anesthesia Physical  Anesthesia Plan  ASA: II  Anesthesia Plan: Spinal and MAC   Post-op Pain Management:  Regional for Post-op pain   Induction: Intravenous  PONV Risk Score and Plan: 2 and Ondansetron and Treatment may vary due to age or medical condition  Airway Management Planned:   Additional Equipment:   Intra-op Plan:   Post-operative Plan: Extubation in OR  Informed Consent: I have reviewed the patients History and Physical, chart, labs and discussed the procedure including the risks, benefits and alternatives for the proposed anesthesia with the patient or authorized representative who has indicated his/her understanding and acceptance.   Dental Advisory  Given  Plan Discussed with: Anesthesiologist, Surgeon and CRNA  Anesthesia Plan Comments:        Anesthesia Quick Evaluation

## 2018-02-07 ENCOUNTER — Ambulatory Visit (HOSPITAL_COMMUNITY): Payer: Medicare Other | Admitting: Certified Registered Nurse Anesthetist

## 2018-02-07 ENCOUNTER — Encounter (HOSPITAL_COMMUNITY)
Admission: RE | Disposition: A | Discharge status: Home / Self Care | Payer: Self-pay | Source: Ambulatory Visit | Attending: Orthopedic Surgery

## 2018-02-07 ENCOUNTER — Other Ambulatory Visit: Payer: Self-pay

## 2018-02-07 ENCOUNTER — Observation Stay (HOSPITAL_COMMUNITY)
Admission: RE | Admit: 2018-02-07 | Discharge: 2018-02-08 | Disposition: A | Discharge status: Home / Self Care | Payer: Medicare Other | Source: Ambulatory Visit | Attending: Orthopedic Surgery | Admitting: Orthopedic Surgery

## 2018-02-07 ENCOUNTER — Encounter (HOSPITAL_COMMUNITY): Payer: Self-pay | Admitting: *Deleted

## 2018-02-07 DIAGNOSIS — N400 Enlarged prostate without lower urinary tract symptoms: Secondary | ICD-10-CM | POA: Diagnosis not present

## 2018-02-07 DIAGNOSIS — R262 Difficulty in walking, not elsewhere classified: Secondary | ICD-10-CM | POA: Insufficient documentation

## 2018-02-07 DIAGNOSIS — I10 Essential (primary) hypertension: Secondary | ICD-10-CM | POA: Insufficient documentation

## 2018-02-07 DIAGNOSIS — G8918 Other acute postprocedural pain: Secondary | ICD-10-CM | POA: Diagnosis not present

## 2018-02-07 DIAGNOSIS — Z7982 Long term (current) use of aspirin: Secondary | ICD-10-CM | POA: Insufficient documentation

## 2018-02-07 DIAGNOSIS — Z79899 Other long term (current) drug therapy: Secondary | ICD-10-CM | POA: Insufficient documentation

## 2018-02-07 DIAGNOSIS — M1712 Unilateral primary osteoarthritis, left knee: Principal | ICD-10-CM | POA: Insufficient documentation

## 2018-02-07 DIAGNOSIS — Z87891 Personal history of nicotine dependence: Secondary | ICD-10-CM | POA: Diagnosis not present

## 2018-02-07 DIAGNOSIS — Z96659 Presence of unspecified artificial knee joint: Secondary | ICD-10-CM

## 2018-02-07 HISTORY — PX: TOTAL KNEE ARTHROPLASTY: SHX125

## 2018-02-07 SURGERY — ARTHROPLASTY, KNEE, TOTAL
Anesthesia: Monitor Anesthesia Care | Site: Knee | Laterality: Left

## 2018-02-07 MED ORDER — BUPIVACAINE LIPOSOME 1.3 % IJ SUSP
INTRAMUSCULAR | Status: DC | PRN
  Administered 2018-02-07: 20 mL

## 2018-02-07 MED ORDER — ACETAMINOPHEN 500 MG PO TABS
1000.0000 mg | ORAL_TABLET | Freq: Four times a day (QID) | ORAL | Status: AC
  Administered 2018-02-07 – 2018-02-08 (×4): 1000 mg via ORAL
  Filled 2018-02-07 (×4): qty 2

## 2018-02-07 MED ORDER — CHLORHEXIDINE GLUCONATE 4 % EX LIQD: 60.0000 mL | Freq: Once | CUTANEOUS | Status: DC

## 2018-02-07 MED ORDER — MIDAZOLAM HCL 2 MG/2ML IJ SOLN
INTRAMUSCULAR | Status: AC
  Filled 2018-02-07: qty 2

## 2018-02-07 MED ORDER — PANTOPRAZOLE SODIUM 40 MG PO TBEC
40.0000 mg | DELAYED_RELEASE_TABLET | Freq: Every day | ORAL | Status: DC
  Administered 2018-02-07 – 2018-02-08 (×2): 40 mg via ORAL
  Filled 2018-02-07 (×2): qty 1

## 2018-02-07 MED ORDER — LACTATED RINGERS IV SOLN
INTRAVENOUS | Status: DC
  Administered 2018-02-07 (×2): via INTRAVENOUS

## 2018-02-07 MED ORDER — PHENYLEPHRINE 40 MCG/ML (10ML) SYRINGE FOR IV PUSH (FOR BLOOD PRESSURE SUPPORT)
PREFILLED_SYRINGE | INTRAVENOUS | Status: DC | PRN
  Administered 2018-02-07 (×6): 80 ug via INTRAVENOUS

## 2018-02-07 MED ORDER — ONDANSETRON HCL 4 MG/2ML IJ SOLN
INTRAMUSCULAR | Status: AC
  Filled 2018-02-07: qty 2

## 2018-02-07 MED ORDER — FERROUS SULFATE 325 (65 FE) MG PO TABS
325.0000 mg | ORAL_TABLET | Freq: Three times a day (TID) | ORAL | Status: DC
  Administered 2018-02-08: 325 mg via ORAL
  Filled 2018-02-07: qty 1

## 2018-02-07 MED ORDER — ONDANSETRON HCL 4 MG/2ML IJ SOLN: 4.0000 mg | Freq: Four times a day (QID) | INTRAMUSCULAR | Status: DC | PRN

## 2018-02-07 MED ORDER — 0.9 % SODIUM CHLORIDE (POUR BTL) OPTIME
TOPICAL | Status: DC | PRN
  Administered 2018-02-07: 1000 mL

## 2018-02-07 MED ORDER — METHOCARBAMOL 500 MG PO TABS
500.0000 mg | ORAL_TABLET | Freq: Four times a day (QID) | ORAL | Status: DC | PRN
  Administered 2018-02-07 – 2018-02-08 (×3): 500 mg via ORAL
  Filled 2018-02-07 (×3): qty 1

## 2018-02-07 MED ORDER — GABAPENTIN 300 MG PO CAPS
300.0000 mg | ORAL_CAPSULE | Freq: Three times a day (TID) | ORAL | Status: DC
  Administered 2018-02-07 – 2018-02-08 (×3): 300 mg via ORAL
  Filled 2018-02-07 (×3): qty 1

## 2018-02-07 MED ORDER — BUPIVACAINE-EPINEPHRINE (PF) 0.5% -1:200000 IJ SOLN
INTRAMUSCULAR | Status: AC
  Filled 2018-02-07: qty 30

## 2018-02-07 MED ORDER — DEXAMETHASONE SODIUM PHOSPHATE 10 MG/ML IJ SOLN
INTRAMUSCULAR | Status: AC
  Filled 2018-02-07: qty 1

## 2018-02-07 MED ORDER — ONDANSETRON HCL 4 MG PO TABS: 4.0000 mg | ORAL_TABLET | Freq: Four times a day (QID) | ORAL | Status: DC | PRN

## 2018-02-07 MED ORDER — FENTANYL CITRATE (PF) 100 MCG/2ML IJ SOLN: 25.0000 ug | INTRAMUSCULAR | Status: DC | PRN

## 2018-02-07 MED ORDER — EPHEDRINE SULFATE-NACL 50-0.9 MG/10ML-% IV SOSY
PREFILLED_SYRINGE | INTRAVENOUS | Status: DC | PRN
  Administered 2018-02-07 (×2): 5 mg via INTRAVENOUS
  Administered 2018-02-07 (×2): 10 mg via INTRAVENOUS

## 2018-02-07 MED ORDER — ROPIVACAINE HCL 7.5 MG/ML IJ SOLN
INTRAMUSCULAR | Status: DC | PRN
  Administered 2018-02-07: 30 mL via PERINEURAL

## 2018-02-07 MED ORDER — BUPIVACAINE IN DEXTROSE 0.75-8.25 % IT SOLN
INTRATHECAL | Status: DC | PRN
  Administered 2018-02-07: 2 mL via INTRATHECAL

## 2018-02-07 MED ORDER — CEFAZOLIN SODIUM-DEXTROSE 2-4 GM/100ML-% IV SOLN
2.0000 g | INTRAVENOUS | Status: AC
  Administered 2018-02-07: 2 g via INTRAVENOUS
  Filled 2018-02-07: qty 100

## 2018-02-07 MED ORDER — SENNOSIDES-DOCUSATE SODIUM 8.6-50 MG PO TABS: 1.0000 | ORAL_TABLET | Freq: Every evening | ORAL | Status: DC | PRN

## 2018-02-07 MED ORDER — CEFAZOLIN SODIUM-DEXTROSE 2-4 GM/100ML-% IV SOLN
2.0000 g | Freq: Four times a day (QID) | INTRAVENOUS | Status: AC
  Administered 2018-02-07 (×2): 2 g via INTRAVENOUS
  Filled 2018-02-07 (×2): qty 100

## 2018-02-07 MED ORDER — SODIUM CHLORIDE 0.9% FLUSH
INTRAVENOUS | Status: DC | PRN
  Administered 2018-02-07: 20 mL

## 2018-02-07 MED ORDER — HYDROMORPHONE HCL 1 MG/ML IJ SOLN: 0.5000 mg | INTRAMUSCULAR | Status: DC | PRN

## 2018-02-07 MED ORDER — FLEET ENEMA 7-19 GM/118ML RE ENEM: 1.0000 | ENEMA | Freq: Once | RECTAL | Status: DC | PRN

## 2018-02-07 MED ORDER — ZOLPIDEM TARTRATE 5 MG PO TABS: 5.0000 mg | ORAL_TABLET | Freq: Every evening | ORAL | Status: DC | PRN

## 2018-02-07 MED ORDER — SODIUM CHLORIDE 0.9 % IJ SOLN
INTRAMUSCULAR | Status: AC
  Filled 2018-02-07: qty 20

## 2018-02-07 MED ORDER — FENTANYL CITRATE (PF) 100 MCG/2ML IJ SOLN
INTRAMUSCULAR | Status: AC
  Filled 2018-02-07: qty 2

## 2018-02-07 MED ORDER — ACETAMINOPHEN 500 MG PO TABS
1000.0000 mg | ORAL_TABLET | Freq: Once | ORAL | Status: AC
  Administered 2018-02-07: 1000 mg via ORAL
  Filled 2018-02-07: qty 2

## 2018-02-07 MED ORDER — DIPHENHYDRAMINE HCL 12.5 MG/5ML PO ELIX: 12.5000 mg | ORAL_SOLUTION | ORAL | Status: DC | PRN

## 2018-02-07 MED ORDER — FINASTERIDE 5 MG PO TABS
5.0000 mg | ORAL_TABLET | Freq: Two times a day (BID) | ORAL | Status: DC
  Administered 2018-02-07 – 2018-02-08 (×2): 5 mg via ORAL
  Filled 2018-02-07 (×2): qty 1

## 2018-02-07 MED ORDER — PROPOFOL 10 MG/ML IV BOLUS
INTRAVENOUS | Status: AC
  Filled 2018-02-07: qty 80

## 2018-02-07 MED ORDER — MENTHOL 3 MG MT LOZG: 1.0000 | LOZENGE | OROMUCOSAL | Status: DC | PRN

## 2018-02-07 MED ORDER — METHOCARBAMOL 500 MG IVPB - SIMPLE MED
500.0000 mg | Freq: Four times a day (QID) | INTRAVENOUS | Status: DC | PRN
  Filled 2018-02-07: qty 50

## 2018-02-07 MED ORDER — PHENYLEPHRINE 40 MCG/ML (10ML) SYRINGE FOR IV PUSH (FOR BLOOD PRESSURE SUPPORT)
PREFILLED_SYRINGE | INTRAVENOUS | Status: AC
  Filled 2018-02-07: qty 10

## 2018-02-07 MED ORDER — ONDANSETRON HCL 4 MG/2ML IJ SOLN
INTRAMUSCULAR | Status: DC | PRN
  Administered 2018-02-07: 4 mg via INTRAVENOUS

## 2018-02-07 MED ORDER — METOCLOPRAMIDE HCL 5 MG/ML IJ SOLN: 5.0000 mg | Freq: Three times a day (TID) | INTRAMUSCULAR | Status: DC | PRN

## 2018-02-07 MED ORDER — GABAPENTIN 300 MG PO CAPS
300.0000 mg | ORAL_CAPSULE | Freq: Once | ORAL | Status: AC
  Administered 2018-02-07: 300 mg via ORAL
  Filled 2018-02-07: qty 1

## 2018-02-07 MED ORDER — MEPERIDINE HCL 50 MG/ML IJ SOLN: 6.2500 mg | INTRAMUSCULAR | Status: DC | PRN

## 2018-02-07 MED ORDER — PROPOFOL 500 MG/50ML IV EMUL
INTRAVENOUS | Status: DC | PRN
  Administered 2018-02-07: 75 ug/kg/min via INTRAVENOUS

## 2018-02-07 MED ORDER — ALFUZOSIN HCL ER 10 MG PO TB24
10.0000 mg | ORAL_TABLET | Freq: Every day | ORAL | Status: DC
  Administered 2018-02-07: 10 mg via ORAL
  Filled 2018-02-07: qty 1

## 2018-02-07 MED ORDER — BISACODYL 5 MG PO TBEC: 5.0000 mg | DELAYED_RELEASE_TABLET | Freq: Every day | ORAL | Status: DC | PRN

## 2018-02-07 MED ORDER — ALUM & MAG HYDROXIDE-SIMETH 200-200-20 MG/5ML PO SUSP: 30.0000 mL | ORAL | Status: DC | PRN

## 2018-02-07 MED ORDER — DEXAMETHASONE SODIUM PHOSPHATE 10 MG/ML IJ SOLN: 8.0000 mg | Freq: Once | INTRAMUSCULAR | Status: DC

## 2018-02-07 MED ORDER — SODIUM CHLORIDE 0.9 % IV SOLN
INTRAVENOUS | Status: DC
  Administered 2018-02-07: 16:00:00 via INTRAVENOUS

## 2018-02-07 MED ORDER — TRAMADOL HCL 50 MG PO TABS
50.0000 mg | ORAL_TABLET | Freq: Four times a day (QID) | ORAL | Status: DC
  Administered 2018-02-07 – 2018-02-08 (×4): 50 mg via ORAL
  Filled 2018-02-07 (×4): qty 1

## 2018-02-07 MED ORDER — EPHEDRINE 5 MG/ML INJ
INTRAVENOUS | Status: AC
  Filled 2018-02-07: qty 10

## 2018-02-07 MED ORDER — DEXAMETHASONE SODIUM PHOSPHATE 10 MG/ML IJ SOLN
10.0000 mg | Freq: Once | INTRAMUSCULAR | Status: AC
  Administered 2018-02-08: 10 mg via INTRAVENOUS
  Filled 2018-02-07: qty 1

## 2018-02-07 MED ORDER — STERILE WATER FOR IRRIGATION IR SOLN
Status: DC | PRN
  Administered 2018-02-07: 2000 mL

## 2018-02-07 MED ORDER — DOCUSATE SODIUM 100 MG PO CAPS
100.0000 mg | ORAL_CAPSULE | Freq: Two times a day (BID) | ORAL | Status: DC
  Administered 2018-02-07 – 2018-02-08 (×3): 100 mg via ORAL
  Filled 2018-02-07 (×3): qty 1

## 2018-02-07 MED ORDER — OXYCODONE HCL 5 MG PO TABS
5.0000 mg | ORAL_TABLET | ORAL | Status: DC | PRN
  Administered 2018-02-07: 5 mg via ORAL
  Filled 2018-02-07: qty 1

## 2018-02-07 MED ORDER — TRANEXAMIC ACID 1000 MG/10ML IV SOLN
1000.0000 mg | INTRAVENOUS | Status: AC
  Administered 2018-02-07: 1000 mg via INTRAVENOUS
  Filled 2018-02-07: qty 10

## 2018-02-07 MED ORDER — PROPOFOL 10 MG/ML IV BOLUS
INTRAVENOUS | Status: DC | PRN
  Administered 2018-02-07: 20 mg via INTRAVENOUS

## 2018-02-07 MED ORDER — ASPIRIN EC 325 MG PO TBEC
325.0000 mg | DELAYED_RELEASE_TABLET | Freq: Two times a day (BID) | ORAL | Status: DC
  Administered 2018-02-08: 325 mg via ORAL
  Filled 2018-02-07: qty 1

## 2018-02-07 MED ORDER — AMLODIPINE BESYLATE 5 MG PO TABS
5.0000 mg | ORAL_TABLET | Freq: Every morning | ORAL | Status: DC
  Filled 2018-02-07: qty 1

## 2018-02-07 MED ORDER — MIDAZOLAM HCL 2 MG/2ML IJ SOLN
INTRAMUSCULAR | Status: DC | PRN
  Administered 2018-02-07 (×2): 1 mg via INTRAVENOUS

## 2018-02-07 MED ORDER — DEXAMETHASONE SODIUM PHOSPHATE 10 MG/ML IJ SOLN
INTRAMUSCULAR | Status: DC | PRN
  Administered 2018-02-07: 10 mg via INTRAVENOUS

## 2018-02-07 MED ORDER — FENTANYL CITRATE (PF) 250 MCG/5ML IJ SOLN
INTRAMUSCULAR | Status: DC | PRN
  Administered 2018-02-07 (×2): 50 ug via INTRAVENOUS

## 2018-02-07 MED ORDER — METOCLOPRAMIDE HCL 5 MG PO TABS: 5.0000 mg | ORAL_TABLET | Freq: Three times a day (TID) | ORAL | Status: DC | PRN

## 2018-02-07 MED ORDER — TRANEXAMIC ACID 1000 MG/10ML IV SOLN
1000.0000 mg | Freq: Once | INTRAVENOUS | Status: AC
  Administered 2018-02-07: 1000 mg via INTRAVENOUS
  Filled 2018-02-07: qty 1000

## 2018-02-07 MED ORDER — BUPIVACAINE-EPINEPHRINE 0.5% -1:200000 IJ SOLN
INTRAMUSCULAR | Status: DC | PRN
  Administered 2018-02-07: 30 mL

## 2018-02-07 MED ORDER — PHENOL 1.4 % MT LIQD: 1.0000 | OROMUCOSAL | Status: DC | PRN

## 2018-02-07 SURGICAL SUPPLY — 57 items
BAG ZIPLOCK 12X15 (MISCELLANEOUS) ×3 IMPLANT
BANDAGE ACE 6X5 VEL STRL LF (GAUZE/BANDAGES/DRESSINGS) ×3 IMPLANT
BLADE SAGITTAL 13X1.27X60 (BLADE) ×2 IMPLANT
BLADE SAGITTAL 13X1.27X60MM (BLADE) ×1
BLADE SAW SGTL 83.5X18.5 (BLADE) ×3 IMPLANT
BLADE SURG 15 STRL LF DISP TIS (BLADE) ×1 IMPLANT
BLADE SURG 15 STRL SS (BLADE) ×2
BNDG ELASTIC 6X15 VLCR STRL LF (GAUZE/BANDAGES/DRESSINGS) ×3 IMPLANT
BOWL SMART MIX CTS (DISPOSABLE) ×3 IMPLANT
CEMENT BONE SIMPLEX SPEEDSET (Cement) ×3 IMPLANT
CLOSURE WOUND 1/2 X4 (GAUZE/BANDAGES/DRESSINGS) ×1
COMP FEM PERSONA STD SZ12 LT (Knees) ×3 IMPLANT
COMPONENT FEM PRNSA STD SZ12LT (Knees) ×1 IMPLANT
COVER SURGICAL LIGHT HANDLE (MISCELLANEOUS) ×3 IMPLANT
CUFF TOURN SGL QUICK 34 (TOURNIQUET CUFF) ×2
CUFF TRNQT CYL 34X4X40X1 (TOURNIQUET CUFF) ×1 IMPLANT
DECANTER SPIKE VIAL GLASS SM (MISCELLANEOUS) ×3 IMPLANT
DRAPE INCISE IOBAN 66X45 STRL (DRAPES) ×6 IMPLANT
DRAPE U-SHAPE 47X51 STRL (DRAPES) ×3 IMPLANT
DRSG AQUACEL AG ADV 3.5X10 (GAUZE/BANDAGES/DRESSINGS) ×3 IMPLANT
DURAPREP 26ML APPLICATOR (WOUND CARE) ×6 IMPLANT
ELECT REM PT RETURN 15FT ADLT (MISCELLANEOUS) ×3 IMPLANT
GLOVE BIOGEL M STRL SZ7.5 (GLOVE) IMPLANT
GLOVE BIOGEL PI IND STRL 7.5 (GLOVE) IMPLANT
GLOVE BIOGEL PI IND STRL 8.5 (GLOVE) ×2 IMPLANT
GLOVE BIOGEL PI INDICATOR 7.5 (GLOVE)
GLOVE BIOGEL PI INDICATOR 8.5 (GLOVE) ×4
GLOVE SURG ORTHO 8.0 STRL STRW (GLOVE) ×9 IMPLANT
GOWN STRL REUS W/ TWL XL LVL3 (GOWN DISPOSABLE) ×2 IMPLANT
GOWN STRL REUS W/TWL 2XL LVL3 (GOWN DISPOSABLE) IMPLANT
GOWN STRL REUS W/TWL XL LVL3 (GOWN DISPOSABLE) ×4
HANDPIECE INTERPULSE COAX TIP (DISPOSABLE) ×2
HOLDER FOLEY CATH W/STRAP (MISCELLANEOUS) ×3 IMPLANT
HOOD PEEL AWAY FLYTE STAYCOOL (MISCELLANEOUS) ×12 IMPLANT
INSERT TIBIAL PLY L CD10-12X11 (Knees) ×3 IMPLANT
MANIFOLD NEPTUNE II (INSTRUMENTS) ×3 IMPLANT
NS IRRIG 1000ML POUR BTL (IV SOLUTION) ×3 IMPLANT
PACK TOTAL KNEE CUSTOM (KITS) ×3 IMPLANT
POSITIONER SURGICAL ARM (MISCELLANEOUS) ×3 IMPLANT
SET HNDPC FAN SPRY TIP SCT (DISPOSABLE) ×1 IMPLANT
SPONGE LAP 18X18 RF (DISPOSABLE) IMPLANT
STEM POLY PAT PLY 38M KNEE (Knees) ×3 IMPLANT
STEM TIBIA 5 DEG SZ G L KNEE (Knees) ×1 IMPLANT
STRIP CLOSURE SKIN 1/2X4 (GAUZE/BANDAGES/DRESSINGS) ×2 IMPLANT
SUT BONE WAX W31G (SUTURE) ×3 IMPLANT
SUT MNCRL AB 4-0 PS2 18 (SUTURE) ×3 IMPLANT
SUT STRATAFIX 0 PDS 27 VIOLET (SUTURE) ×3
SUT STRATAFIX PDS+ 0 24IN (SUTURE) ×3 IMPLANT
SUT VIC AB 1 CT1 27 (SUTURE) ×2
SUT VIC AB 1 CT1 27XBRD ANTBC (SUTURE) ×1 IMPLANT
SUTURE STRATFX 0 PDS 27 VIOLET (SUTURE) ×1 IMPLANT
SYR CONTROL 10ML LL (SYRINGE) ×6 IMPLANT
TIBIA STEM 5 DEG SZ G L KNEE (Knees) ×3 IMPLANT
TRAY FOLEY MTR SLVR 16FR STAT (SET/KITS/TRAYS/PACK) ×3 IMPLANT
WATER STERILE IRR 1000ML POUR (IV SOLUTION) ×6 IMPLANT
WRAP KNEE MAXI GEL POST OP (GAUZE/BANDAGES/DRESSINGS) ×3 IMPLANT
YANKAUER SUCT BULB TIP 10FT TU (MISCELLANEOUS) ×3 IMPLANT

## 2018-02-07 NOTE — H&P (Signed)
DAVYD PODGORSKI MRN:  [id number] DOB/SEX:  1940/10/02/male  CHIEF COMPLAINT:  Painful left Knee  HISTORY: Patient is a 77 y.o. male presented with a history of pain in the left knee. Onset of symptoms was gradual starting a few years ago with gradually worsening course since that time. Patient has been treated conservatively with over-the-counter NSAIDs and activity modification. Patient currently rates pain in the knee at 10 out of 10 with activity. There is pain at night.  PAST MEDICAL HISTORY: Patient Active Problem List   Diagnosis Date Noted  . 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 BPH (benign prostatic hyperplasia)   . Complication of anesthesia    post op urinary retention  . History of urinary retention    post op left TKA 01/ 2016  . Hypertension    followed by pcp  . Nocturia   . OA (osteoarthritis)    left knee, elbows, hands  . PONV (postoperative nausea and vomiting)   . Wears glasses    Past Surgical History:  Procedure Laterality Date  . CATARACT EXTRACTION W/ INTRAOCULAR LENS  IMPLANT, BILATERAL  2016  . TOTAL KNEE ARTHROPLASTY Right 06/04/2014   Procedure: TOTAL KNEE ARTHROPLASTY;  Surgeon: Vickey Huger, MD;  Location: [facility];  Service: Orthopedics;  Laterality: Right;     MEDICATIONS:   Medications Prior to Admission  Medication Sig Dispense Refill Last Dose  . alfuzosin (UROXATRAL) 10 MG 24 hr tablet Take 10 mg by mouth at bedtime.   02/06/2018 at Unknown time  . amLODipine (NORVASC) 5 MG tablet Take 5 mg by mouth every morning.    02/07/2018 at 0400  . aspirin EC 81 MG tablet Take 81 mg by mouth at bedtime.   02/03/2018  . Bioflavonoid Products (BIOFLEX PO) Take 1 tablet by mouth daily.   Past Week at Unknown time  . finasteride (PROSCAR) 5 MG tablet Take 5 mg by mouth 2 (two) times daily.    02/07/2018 at 0400  . Multiple Vitamins-Minerals (MULTIVITAMIN WITH MINERALS) tablet Take 1 tablet by mouth daily.   Past Week at  Unknown time  . Multiple Vitamins-Minerals (PRESERVISION AREDS PO) Take 1 capsule by mouth daily.   Past Week at Unknown time    ALLERGIES:  No Known Allergies  REVIEW OF SYSTEMS:  A comprehensive review of systems was negative except for: Musculoskeletal: positive for arthralgias and bone pain   FAMILY HISTORY:  History reviewed. No pertinent family history.  SOCIAL HISTORY:   Social History   Tobacco Use  . Smoking status: Former Smoker    Packs/day: 1.00    Years: 2.00    Pack years: 2.00    Types: Cigarettes    Last attempt to quit: 01/26/1963    Years since quitting: 55.0  . Smokeless tobacco: Never Used  Substance Use Topics  . Alcohol use: No     EXAMINATION:  Vital signs in last 24 hours: Temp:  [98.3 F (36.8 C)] 98.3 F (36.8 C) (09/23 0556) Pulse Rate:  [84] 84 (09/23 0556) Resp:  [14] 14 (09/23 0556) BP: (158)/(96) 158/96 (09/23 0556) SpO2:  [98 %] 98 % (09/23 0556) Weight:  [81.9 kg] 81.9 kg (09/23 0556)  BP (!) 158/96   Pulse 84   Temp 98.3 F (36.8 C) (Oral)   Resp 14   Ht 6\' 2"  (1.88 m)   Wt 81.9 kg   SpO2 98%   BMI 23.17 kg/m   General Appearance:  Alert, cooperative, no distress, appears stated age  Head:    Normocephalic, without obvious abnormality, atraumatic  Eyes:    PERRL, conjunctiva/corneas clear, EOM's intact, fundi    benign, both eyes       Ears:    Normal TM's and external ear canals, both ears  Nose:   Nares normal, septum midline, mucosa normal, no drainage    or sinus tenderness  Throat:   Lips, mucosa, and tongue normal; teeth and gums normal  Neck:   Supple, symmetrical, trachea midline, no adenopathy;       thyroid:  No enlargement/tenderness/nodules; no carotid   bruit or JVD  Back:     Symmetric, no curvature, ROM normal, no CVA tenderness  Lungs:     Clear to auscultation bilaterally, respirations unlabored  Chest wall:    No tenderness or deformity  Heart:    Regular rate and rhythm, S1 and S2 normal, no murmur,  rub   or gallop  Abdomen:     Soft, non-tender, bowel sounds active all four quadrants,    no masses, no organomegaly  Genitalia:    Normal male without lesion, discharge or tenderness  Rectal:    Normal tone, normal prostate, no masses or tenderness;   guaiac negative stool  Extremities:   Extremities normal, atraumatic, no cyanosis or edema  Pulses:   2+ and symmetric all extremities  Skin:   Skin color, texture, turgor normal, no rashes or lesions  Lymph nodes:   Cervical, supraclavicular, and axillary nodes normal  Neurologic:   CNII-XII intact. Normal strength, sensation and reflexes      throughout    Musculoskeletal:  ROM 0-120, Ligaments intact,  Imaging Review Plain radiographs demonstrate severe degenerative joint disease of the left knee. The overall alignment is neutral. The bone quality appears to be good for age and reported activity level.  Assessment/Plan: Primary osteoarthritis, left knee   The patient history, physical examination and imaging studies are consistent with advanced degenerative joint disease of the left knee. The patient has failed conservative treatment.  The clearance notes were reviewed.  After discussion with the patient it was felt that Total Knee Replacement was indicated. The procedure,  risks, and benefits of total knee arthroplasty were presented and reviewed. The risks including but not limited to aseptic loosening, infection, blood clots, vascular injury, stiffness, patella tracking problems complications among others were discussed. The patient acknowledged the explanation, agreed to proceed with the plan.  Preoperative templating of the joint replacement has been completed, documented, and submitted to the Operating Room personnel in order to optimize intra-operative equipment management.    Patient's anticipated LOS is less than 2 midnights, meeting these requirements: - Lives within 1 hour of care - Has a competent adult at home to recover  with post-op recover - NO history of  - Chronic pain requiring opiods  - Diabetes  - Coronary Artery Disease  - Heart failure  - Heart attack  - Stroke  - DVT/VTE  - Cardiac arrhythmia  - Respiratory Failure/COPD  - Renal failure  - Anemia  - Advanced Liver disease       Donia Ast 02/07/2018, 6:16 AM

## 2018-02-07 NOTE — Anesthesia Postprocedure Evaluation (Signed)
Anesthesia Post Note  Patient: Adam [name]  Procedure(s) Performed: LEFT TOTAL KNEE ARTHROPLASTY (Left Knee)     Patient location during evaluation: PACU Anesthesia Type: MAC Level of consciousness: awake and alert Pain management: pain level controlled Vital Signs Assessment: post-procedure vital signs reviewed and stable Respiratory status: spontaneous breathing, nonlabored ventilation, respiratory function stable and patient connected to nasal cannula oxygen Cardiovascular status: stable and blood pressure returned to baseline Postop Assessment: no apparent nausea or vomiting Anesthetic complications: no    Last Vitals:  Vitals:   02/07/18 0912 02/07/18 0915  BP: 108/80 114/72  Pulse: 85 87  Resp: 16 16  Temp: 36.4 C   SpO2: 97% 100%    Last Pain:  Vitals:   02/07/18 0912  TempSrc:   PainSc: 0-No pain    LLE Motor Response: Purposeful movement (02/07/18 0912) LLE Sensation: Decreased (02/07/18 0912) RLE Motor Response: Purposeful movement (02/07/18 0912) RLE Sensation: Decreased (02/07/18 0912) L Sensory Level: L2-Upper inner thigh, upper buttock (02/07/18 0912) R Sensory Level: L2-Upper inner thigh, upper buttock (02/07/18 0912)  Nikala Walsworth

## 2018-02-07 NOTE — Anesthesia Procedure Notes (Addendum)
Anesthesia Regional Block: Adductor canal block   Pre-Anesthetic Checklist: ,, timeout performed, Correct Patient, Correct Site, Correct Laterality, Correct Procedure, Correct Position, site marked, Risks and benefits discussed,  Surgical consent,  Pre-op evaluation,  At surgeon's request and post-op pain management  Laterality: Left  Prep: chloraprep       Needles:  Injection technique: Single-shot  Needle Type: Echogenic Stimulator Needle     Needle Length: 5cm  Needle Gauge: 22     Additional Needles:   Procedures:, nerve stimulator,,, ultrasound used (permanent image in chart),,,,  Narrative:  Start time: 02/07/2018 6:56 AM End time: 02/07/2018 7:00 AM Injection made incrementally with aspirations every 5 mL.  Performed by: Personally  Anesthesiologist: Janeece Riggers, MD  Additional Notes: Functioning IV was confirmed and monitors were applied.  A 34mm 22ga Arrow echogenic stimulator needle was used. Sterile prep and drape,hand hygiene and sterile gloves were used. Ultrasound guidance: relevant anatomy identified, needle position confirmed, local anesthetic spread visualized around nerve(s)., vascular puncture avoided.  Image printed for medical record. Negative aspiration and negative test dose prior to incremental administration of local anesthetic. The patient tolerated the procedure well.

## 2018-02-07 NOTE — Evaluation (Signed)
Physical Therapy Evaluation Patient Details Name: Adam [name] MRN: [id number] DOB: [birth year] Today's Date: 02/07/2018   History of Present Illness  77 yo male s/p L TKA 9/21. Hx of R TKA  Clinical Impression  On eval POD0, pt required Min assist for mobility. He walked ~60 feet with a RW. Pt denied pain. Noted L knee buckling with ambulation. Will continue to follow and progress activity as tolerated. Plan is for d/c home with HHPT f/u, possibly tomorrow.     Follow Up Recommendations Follow surgeon's recommendation for DC plan and follow-up therapies    Equipment Recommendations  None recommended by PT    Recommendations for Other Services       Precautions / Restrictions Precautions Precautions: Fall;Knee Restrictions Weight Bearing Restrictions: No Other Position/Activity Restrictions: WBAT      Mobility  Bed Mobility Overal bed mobility: Needs Assistance Bed Mobility: Supine to Sit     Supine to sit: Supervision;HOB elevated     General bed mobility comments: for safety  Transfers Overall transfer level: Needs assistance Equipment used: Rolling walker (2 wheeled) Transfers: Sit to/from Stand Sit to Stand: Min assist         General transfer comment: Assist to rise, stabilize. VCs safety, technique, hand/LE placement  Ambulation/Gait Ambulation/Gait assistance: Min assist Gait Distance (Feet): 60 Feet Assistive device: Rolling walker (2 wheeled) Gait Pattern/deviations: Step-to pattern     General Gait Details: VCs safety, technique, sequence. Assist to stabilize due to intermittent L knee buckling. Pt c/o some lightheadedness/dizziness towards end of distance  Stairs            Wheelchair Mobility    Modified Rankin (Stroke Patients Only)       Balance                                             Pertinent Vitals/Pain Pain Assessment: No/denies pain    Home Living Family/patient expects to be discharged to::  Private residence Living Arrangements: Spouse/significant other Available Help at Discharge: Family Type of Home: House Home Access: Stairs to enter Entrance Stairs-Rails: Left;Right;Can reach both Technical brewer of Steps: [place]: One level Home Equipment: Environmental consultant - 2 wheels;Bedside commode      Prior Function Level of Independence: Independent               Hand Dominance        Extremity/Trunk Assessment   Upper Extremity Assessment Upper Extremity Assessment: Overall WFL for tasks assessed    Lower Extremity Assessment Lower Extremity Assessment: LLE deficits/detail LLE Deficits / Details: SLR: 2/5 with ext lag, fair quad set. Pt c/o LE still feeling numb    Cervical / Trunk Assessment Cervical / Trunk Assessment: Normal  Communication   Communication: No difficulties  Cognition Arousal/Alertness: Awake/alert Behavior During Therapy: WFL for tasks assessed/performed Overall Cognitive Status: Within Functional Limits for tasks assessed                                        General Comments      Exercises Total Joint Exercises Quad Sets: AROM;Both;10 reps;Seated Straight Leg Raises: AAROM;Left;10 reps;Seated   Assessment/Plan    PT Assessment Patient needs continued PT services  PT Problem List Decreased strength;Decreased balance;Decreased mobility;Decreased activity tolerance;Decreased range of motion;Pain;Decreased  knowledge of use of DME;Decreased knowledge of precautions       PT Treatment Interventions DME instruction;Gait training;Therapeutic activities;Functional mobility training;Balance training;Patient/family education;Therapeutic exercise;Stair training    PT Goals (Current goals can be found in the Care Plan section)  Acute Rehab PT Goals Patient Stated Goal: regain PLOF/independence PT Goal Formulation: With patient Time For Goal Achievement: 02/21/18 Potential to Achieve Goals: Good    Frequency  7X/week   Barriers to discharge        Co-evaluation               AM-PAC PT "6 Clicks" Daily Activity  Outcome Measure Difficulty turning over in bed (including adjusting bedclothes, sheets and blankets)?: A Little Difficulty moving from lying on back to sitting on the side of the bed? : A Little Difficulty sitting down on and standing up from a chair with arms (e.g., wheelchair, bedside commode, etc,.)?: A Little Help needed moving to and from a bed to chair (including a wheelchair)?: A Little Help needed walking in hospital room?: A Little Help needed climbing 3-5 steps with a railing? : A Lot 6 Click Score: 17    End of Session Equipment Utilized During Treatment: Gait belt Activity Tolerance: Patient tolerated treatment well Patient left: in chair;with call bell/phone within reach;with family/visitor present   PT Visit Diagnosis: Pain;Difficulty in walking, not elsewhere classified (R26.2);Other abnormalities of gait and mobility (R26.89) Pain - Right/Left: Left Pain - part of body: Knee    Time: 3662-9476 PT Time Calculation (min) (ACUTE ONLY): 19 min   Charges:   PT Evaluation $PT Eval Low Complexity: [place], PT Acute Rehabilitation Services Pager: [phone] Office: [phone]

## 2018-02-07 NOTE — Anesthesia Procedure Notes (Signed)
Spinal  Patient location during procedure: OR Start time: 02/07/2018 7:35 AM End time: 02/07/2018 7:37 AM Staffing Anesthesiologist: Janeece Riggers, MD Resident/CRNA: Genelle Bal, CRNA Performed: resident/CRNA  Preanesthetic Checklist Completed: patient identified, site marked, surgical consent, pre-op evaluation, timeout performed, IV checked, risks and benefits discussed and monitors and equipment checked Spinal Block Patient position: sitting Prep: DuraPrep Patient monitoring: heart rate, cardiac monitor, continuous pulse ox and blood pressure Approach: midline Location: L3-4 Injection technique: single-shot Needle Needle type: Pencan  Needle gauge: 24 G Needle length: 9 cm Assessment Sensory level: T6 Additional Notes Spinal attempt x1, + clear, free-flowing CSF, easy to inject, - paresthesia, level to T6, NAC.

## 2018-02-07 NOTE — Transfer of Care (Signed)
Immediate Anesthesia Transfer of Care Note  Patient: Adam [name]  Procedure(s) Performed: LEFT TOTAL KNEE ARTHROPLASTY (Left Knee)  Patient Location: PACU  Anesthesia Type:Spinal and MAC combined with regional for post-op pain  Level of Consciousness: awake, alert , oriented and patient cooperative  Airway & Oxygen Therapy: Patient Spontanous Breathing and Patient connected to face mask oxygen  Post-op Assessment: Report given to RN and Post -op Vital signs reviewed and stable  Post vital signs: Reviewed and stable  Last Vitals:  Vitals Value Taken Time  BP    Temp    Pulse 83 02/07/2018  9:11 AM  Resp    SpO2 95 % 02/07/2018  9:11 AM  Vitals shown include unvalidated device data.  Last Pain:  Vitals:   02/07/18 0556  TempSrc: Oral         Complications: No apparent anesthesia complications

## 2018-02-07 NOTE — Plan of Care (Signed)
  Problem: Health Behavior/Discharge Planning: Goal: Ability to manage health-related needs will improve Outcome: Progressing   Problem: Clinical Measurements: Goal: Ability to maintain clinical measurements within normal limits will improve Outcome: Progressing   Problem: Clinical Measurements: Goal: Will remain free from infection Outcome: Progressing   Problem: Clinical Measurements: Goal: Diagnostic test results will improve Outcome: Progressing   Problem: Clinical Measurements: Goal: Cardiovascular complication will be avoided Outcome: Progressing   Problem: Activity: Goal: Risk for activity intolerance will decrease Outcome: Progressing

## 2018-02-08 ENCOUNTER — Encounter (HOSPITAL_COMMUNITY): Payer: Self-pay | Admitting: Orthopedic Surgery

## 2018-02-08 DIAGNOSIS — N400 Enlarged prostate without lower urinary tract symptoms: Secondary | ICD-10-CM | POA: Diagnosis not present

## 2018-02-08 DIAGNOSIS — Z79899 Other long term (current) drug therapy: Secondary | ICD-10-CM | POA: Diagnosis not present

## 2018-02-08 DIAGNOSIS — R262 Difficulty in walking, not elsewhere classified: Secondary | ICD-10-CM | POA: Diagnosis not present

## 2018-02-08 DIAGNOSIS — M1712 Unilateral primary osteoarthritis, left knee: Secondary | ICD-10-CM | POA: Diagnosis not present

## 2018-02-08 DIAGNOSIS — Z7982 Long term (current) use of aspirin: Secondary | ICD-10-CM | POA: Diagnosis not present

## 2018-02-08 DIAGNOSIS — I10 Essential (primary) hypertension: Secondary | ICD-10-CM | POA: Diagnosis not present

## 2018-02-08 LAB — BASIC METABOLIC PANEL
Anion gap: 6 (ref 5–15)
BUN: 20 mg/dL (ref 8–23)
CALCIUM: 8.2 mg/dL — AB (ref 8.9–10.3)
CO2: 26 mmol/L (ref 22–32)
CREATININE: 0.98 mg/dL (ref 0.61–1.24)
Chloride: 106 mmol/L (ref 98–111)
GFR calc Af Amer: >60 mL/min (ref >60)
Glucose, Bld: 118 mg/dL — ABNORMAL HIGH (ref 70–99)
POTASSIUM: 4.1 mmol/L (ref 3.5–5.1)
Sodium: 138 mmol/L (ref 135–145)

## 2018-02-08 LAB — CBC
HCT: 29.9 % — ABNORMAL LOW (ref 39.0–52.0)
Hemoglobin: 10 g/dL — ABNORMAL LOW (ref 13.0–17.0)
MCH: 30.1 pg (ref 26.0–34.0)
MCHC: 33.4 g/dL (ref 30.0–36.0)
MCV: 90.1 fL (ref 78.0–100.0)
PLATELETS: 187 10*3/uL (ref 150–400)
RBC: 3.32 MIL/uL — AB (ref 4.22–5.81)
RDW: 13.5 % (ref 11.5–15.5)
WBC: 10.7 10*3/uL — ABNORMAL HIGH (ref 4.0–10.5)

## 2018-02-08 MED ORDER — METHOCARBAMOL 500 MG PO TABS: 500.0000 mg | ORAL_TABLET | Freq: Four times a day (QID) | ORAL | 0 refills | Status: AC | PRN

## 2018-02-08 MED ORDER — ASPIRIN 325 MG PO TBEC: 325.0000 mg | DELAYED_RELEASE_TABLET | Freq: Two times a day (BID) | ORAL | 0 refills | Status: AC

## 2018-02-08 MED ORDER — OXYCODONE HCL 5 MG PO TABS: 5.0000 mg | ORAL_TABLET | Freq: Four times a day (QID) | ORAL | 0 refills | Status: AC | PRN

## 2018-02-08 NOTE — Discharge Summary (Signed)
SPORTS MEDICINE & JOINT REPLACEMENT   Lara Mulch, MD   Carlyon Shadow, PA-C [place], [place], [place]  [address]                             [phone]  PATIENT ID: Adam [name]        MRN:  [id number]          DOB/AGE: 10/02/1940 / 77 y.o.    DISCHARGE SUMMARY  ADMISSION DATE:    02/07/2018 DISCHARGE DATE:   02/08/2018   ADMISSION DIAGNOSIS: primary osteoarthritis left knee    DISCHARGE DIAGNOSIS:  primary osteoarthritis left knee    ADDITIONAL DIAGNOSIS: Active Problems:   S/P total knee replacement  Past Medical History:  Diagnosis Date  . BPH (benign prostatic hyperplasia)   . Complication of anesthesia    post op urinary retention  . History of urinary retention    post op left TKA 01/ 2016  . Hypertension    followed by pcp  . Nocturia   . OA (osteoarthritis)    left knee, elbows, hands  . PONV (postoperative nausea and vomiting)   . Wears glasses     PROCEDURE: Procedure(s): LEFT TOTAL KNEE ARTHROPLASTY on 02/07/2018  CONSULTS:    HISTORY:  See H&P in chart  HOSPITAL COURSE:  Adam [name] is a 77 y.o. admitted on 02/07/2018 and found to have a diagnosis of primary osteoarthritis left knee.  After appropriate laboratory studies were obtained  they were taken to the operating room on 02/07/2018 and underwent Procedure(s): LEFT TOTAL KNEE ARTHROPLASTY.   They were given perioperative antibiotics:  Anti-infectives (From admission, onward)   Start     Dose/Rate Route Frequency Ordered Stop   02/07/18 1400  ceFAZolin (ANCEF) IVPB 2g/100 mL premix     2 g 200 mL/hr over 30 Minutes Intravenous Every 6 hours 02/07/18 1039 02/07/18 2008   02/07/18 0600  ceFAZolin (ANCEF) IVPB 2g/100 mL premix     2 g 200 mL/hr over 30 Minutes Intravenous On call to O.R. 02/07/18 0543 02/07/18 0810    .  Patient given tranexamic acid IV or topical and exparel intra-operatively.  Tolerated the procedure well.    POD# 1: Vital signs were stable.  Patient denied  Chest pain, shortness of breath, or calf pain.  Patient was started on Aspirin twice daily at 8am.  Consults to PT, OT, and care management were made.  The patient was weight bearing as tolerated.  CPM was placed on the operative leg 0-90 degrees for 6-8 hours a day. When out of the CPM, patient was placed in the foam block to achieve full extension. Incentive spirometry was taught.  Dressing was changed.       POD #2, Continued  PT for ambulation and exercise program.  IV saline locked.  O2 discontinued.    The remainder of the hospital course was dedicated to ambulation and strengthening.   The patient was discharged on 1 Day Post-Op in  Good condition.  Blood products given:none  DIAGNOSTIC STUDIES: Recent vital signs:  Patient Vitals for the past 24 hrs:  BP Temp Temp src Pulse Resp SpO2  02/08/18 1008 128/82 - - - - -  02/08/18 0640 122/67 97.6 F (36.4 C) Oral 70 - 98 %  02/08/18 0225 111/74 97.9 F (36.6 C) Oral 66 17 98 %  02/08/18 0223 111/74 97.9 F (36.6 C) Oral 74 17 98 %  02/07/18 2205 118/68 98.3 F (36.8 C) Oral 71 16 99 %  02/07/18 1719 119/75 97.6 F (36.4 C) Oral 78 16 99 %  02/07/18 1338 127/83 97.7 F (36.5 C) Oral 80 14 99 %       Recent laboratory studies: Recent Labs    02/08/18 0441  WBC 10.7*  HGB 10.0*  HCT 29.9*  PLT 187   Recent Labs    02/08/18 0441  NA 138  K 4.1  CL 106  CO2 26  BUN 20  CREATININE 0.98  GLUCOSE 118*  CALCIUM 8.2*   Lab Results  Component Value Date   INR 1.02 05/23/2014     Recent Radiographic Studies :  No results found.  DISCHARGE INSTRUCTIONS: Discharge Instructions    CPM   Complete by:  As directed    Continuous passive motion machine (CPM):      Use the CPM from 0 to 90 for 4-6 hours per day.      You may increase by 10 per day.  You may break it up into 2 or 3 sessions per day.      Use CPM for 2 weeks or until you are told to stop.   Call MD / Call 911   Complete by:  As directed    If you  experience chest pain or shortness of breath, CALL 911 and be transported to the hospital emergency room.  If you develope a fever above 101 F, pus (white drainage) or increased drainage or redness at the wound, or calf pain, call your surgeon's office.   Constipation Prevention   Complete by:  As directed    Drink plenty of fluids.  Prune juice may be helpful.  You may use a stool softener, such as Colace (over the counter) 100 mg twice a day.  Use MiraLax (over the counter) for constipation as needed.   Diet - low sodium heart healthy   Complete by:  As directed    Discharge instructions   Complete by:  As directed    INSTRUCTIONS AFTER JOINT REPLACEMENT   Remove items at home which could result in a fall. This includes throw rugs or furniture in walking pathways ICE to the affected joint every three hours while awake for 30 minutes at a time, for at least the first 3-5 days, and then as needed for pain and swelling.  Continue to use ice for pain and swelling. You may notice swelling that will progress down to the foot and ankle.  This is normal after surgery.  Elevate your leg when you are not up walking on it.   Continue to use the breathing machine you got in the hospital (incentive spirometer) which will help keep your temperature down.  It is common for your temperature to cycle up and down following surgery, especially at night when you are not up moving around and exerting yourself.  The breathing machine keeps your lungs expanded and your temperature down.   DIET:  As you were doing prior to hospitalization, we recommend a well-balanced diet.  DRESSING / WOUND CARE / SHOWERING  Keep the surgical dressing until follow up.  The dressing is water proof, so you can shower without any extra covering.  IF THE DRESSING FALLS OFF or the wound gets wet inside, change the dressing with sterile gauze.  Please use good hand washing techniques before changing the dressing.  Do not use any lotions or  creams on the incision until instructed by your  Psychologist, sport and exercise.    ACTIVITY  Increase activity slowly as tolerated, but follow the weight bearing instructions below.   No driving for 6 weeks or until further direction given by your physician.  You cannot drive while taking narcotics.  No lifting or carrying greater than 10 lbs. until further directed by your surgeon. Avoid periods of inactivity such as sitting longer than an hour when not asleep. This helps prevent blood clots.  You may return to work once you are authorized by your doctor.     WEIGHT BEARING   Weight bearing as tolerated with assist device (walker, cane, etc) as directed, use it as long as suggested by your surgeon or therapist, typically at least 4-6 weeks.   EXERCISES  Results after joint replacement surgery are often greatly improved when you follow the exercise, range of motion and muscle strengthening exercises prescribed by your doctor. Safety measures are also important to protect the joint from further injury. Any time any of these exercises cause you to have increased pain or swelling, decrease what you are doing until you are comfortable again and then slowly increase them. If you have problems or questions, call your caregiver or physical therapist for advice.   Rehabilitation is important following a joint replacement. After just a few days of immobilization, the muscles of the leg can become weakened and shrink (atrophy).  These exercises are designed to build up the tone and strength of the thigh and leg muscles and to improve motion. Often times heat used for twenty to thirty minutes before working out will loosen up your tissues and help with improving the range of motion but do not use heat for the first two weeks following surgery (sometimes heat can increase post-operative swelling).   These exercises can be done on a training (exercise) mat, on the floor, on a table or on a bed. Use whatever works the best and is  most comfortable for you.    Use music or television while you are exercising so that the exercises are a pleasant break in your day. This will make your life better with the exercises acting as a break in your routine that you can look forward to.   Perform all exercises about fifteen times, three times per day or as directed.  You should exercise both the operative leg and the other leg as well.   Exercises include:   Quad Sets - Tighten up the muscle on the front of the thigh (Quad) and hold for 5-10 seconds.   Straight Leg Raises - With your knee straight (if you were given a brace, keep it on), lift the leg to 60 degrees, hold for 3 seconds, and slowly lower the leg.  Perform this exercise against resistance later as your leg gets stronger.  Leg Slides: Lying on your back, slowly slide your foot toward your buttocks, bending your knee up off the floor (only go as far as is comfortable). Then slowly slide your foot back down until your leg is flat on the floor again.  Angel Wings: Lying on your back spread your legs to the side as far apart as you can without causing discomfort.  Hamstring Strength:  Lying on your back, push your heel against the floor with your leg straight by tightening up the muscles of your buttocks.  Repeat, but this time bend your knee to a comfortable angle, and push your heel against the floor.  You may put a pillow under the heel to make it  more comfortable if necessary.   A rehabilitation program following joint replacement surgery can speed recovery and prevent re-injury in the future due to weakened muscles. Contact your doctor or a physical therapist for more information on knee rehabilitation.    CONSTIPATION  Constipation is defined medically as fewer than three stools per week and severe constipation as less than one stool per week.  Even if you have a regular bowel pattern at home, your normal regimen is likely to be disrupted due to multiple reasons following  surgery.  Combination of anesthesia, postoperative narcotics, change in appetite and fluid intake all can affect your bowels.   YOU MUST use at least one of the following options; they are listed in order of increasing strength to get the job done.  They are all available over the counter, and you may need to use some, POSSIBLY even all of these options:    Drink plenty of fluids (prune juice may be helpful) and high fiber foods Colace 100 mg by mouth twice a day  Senokot for constipation as directed and as needed Dulcolax (bisacodyl), take with full glass of water  Miralax (polyethylene glycol) once or twice a day as needed.  If you have tried all these things and are unable to have a bowel movement in the first 3-4 days after surgery call either your surgeon or your primary doctor.    If you experience loose stools or diarrhea, hold the medications until you stool forms back up.  If your symptoms do not get better within 1 week or if they get worse, check with your doctor.  If you experience "the worst abdominal pain ever" or develop nausea or vomiting, please contact the office immediately for further recommendations for treatment.   ITCHING:  If you experience itching with your medications, try taking only a single pain pill, or even half a pain pill at a time.  You can also use Benadryl over the counter for itching or also to help with sleep.   TED HOSE STOCKINGS:  Use stockings on both legs until for at least 2 weeks or as directed by physician office. They may be removed at night for sleeping.  MEDICATIONS:  See your medication summary on the "After Visit Summary" that nursing will review with you.  You may have some home medications which will be placed on hold until you complete the course of blood thinner medication.  It is important for you to complete the blood thinner medication as prescribed.  PRECAUTIONS:  If you experience chest pain or shortness of breath - call 911 immediately  for transfer to the hospital emergency department.   If you develop a fever greater that 101 F, purulent drainage from wound, increased redness or drainage from wound, foul odor from the wound/dressing, or calf pain - CONTACT YOUR SURGEON.                                                   FOLLOW-UP APPOINTMENTS:  If you do not already have a post-op appointment, please call the office for an appointment to be seen by your surgeon.  Guidelines for how soon to be seen are listed in your "After Visit Summary", but are typically between 1-4 weeks after surgery.  OTHER INSTRUCTIONS:   Knee Replacement:  Do not place pillow under knee, focus on  keeping the knee straight while resting. CPM instructions: 0-90 degrees, 2 hours in the morning, 2 hours in the afternoon, and 2 hours in the evening. Place foam block, curve side up under heel at all times except when in CPM or when walking.  DO NOT modify, tear, cut, or change the foam block in any way.  MAKE SURE YOU:  Understand these instructions.  Get help right away if you are not doing well or get worse.    Thank you for letting us be a part of your medical care team.  It is a privilege we respect greatly.  We hope these instructions will help you stay on track for a fast and full recovery!   Increase activity slowly as tolerated   Complete by:  As directed       DISCHARGE MEDICATIONS:   Allergies as of 02/08/2018   No Known Allergies     Medication List    TAKE these medications   alfuzosin 10 MG 24 hr tablet Commonly known as:  UROXATRAL Take 10 mg by mouth at bedtime.   amLODipine 5 MG tablet Commonly known as:  NORVASC Take 5 mg by mouth every morning.   aspirin 325 MG EC tablet Take 1 tablet (325 mg total) by mouth 2 (two) times daily. What changed:    medication strength  how much to take  when to take this   BIOFLEX PO Take 1 tablet by mouth daily.   finasteride 5 MG tablet Commonly known as:  PROSCAR Take 5 mg by  mouth 2 (two) times daily.   methocarbamol 500 MG tablet Commonly known as:  ROBAXIN Take 1-2 tablets (500-1,000 mg total) by mouth every 6 (six) hours as needed for muscle spasms.   multivitamin with minerals tablet Take 1 tablet by mouth daily.   PRESERVISION AREDS PO Take 1 capsule by mouth daily.   oxyCODONE 5 MG immediate release tablet Commonly known as:  Oxy IR/ROXICODONE Take 1-2 tablets (5-10 mg total) by mouth every 6 (six) hours as needed for moderate pain (pain score 4-6).            Durable Medical Equipment  (From admission, onward)         Start     Ordered   02/07/18 1040  DME Walker rolling  Once    Question:  Patient needs a walker to treat with the following condition  Answer:  S/P total knee replacement   02/07/18 1039   02/07/18 1040  DME 3 n 1  Once     02/07/18 1039   02/07/18 1040  DME Bedside commode  Once    Question:  Patient needs a bedside commode to treat with the following condition  Answer:  S/P total knee replacement   02/07/18 1039          FOLLOW UP VISIT:   Follow-up Information    [facility].   Specialty:  [place]:  phyiscal therapy Contact information: [address] [place] [address] [phone]           DISPOSITION: HOME VS. SNF  CONDITION:  Good   Donia Ast 02/08/2018, 12:49 PM

## 2018-02-08 NOTE — Care Management Obs Status (Signed)
[place] NOTIFICATION   Patient Details  Name: Adam [name] MRN: [id number] Date of Birth: Aug 06, 1940   Medicare Observation Status Notification Given:  Yes    Guadalupe Maple, RN 02/08/2018, 9:52 AM

## 2018-02-08 NOTE — Progress Notes (Signed)
Physical Therapy Treatment Patient Details Name: Adam [name] MRN: [id number] DOB: [birth year] Today's Date: 02/08/2018    History of Present Illness 77 yo male s/p L TKA 9/21. Hx of R TKA    PT Comments    Pt was participating well until after he completed stair training. He c/o nausea initially, then fatigue. Assisted pt onto mat table to sit and rest. Nausea did not subside so assisted pt into wheelchair and took him back to his room. While transferring pt to his recliner, noted he had become diaphoretic and pale. Assessed BP-82/49-made RN aware. Due to events, will plan to have a 2nd session.     Follow Up Recommendations  Follow surgeon's recommendation for DC plan and follow-up therapies     Equipment Recommendations  None recommended by PT    Recommendations for Other Services       Precautions / Restrictions Precautions Precautions: Fall;Knee Restrictions Weight Bearing Restrictions: No Other Position/Activity Restrictions: WBAT    Mobility  Bed Mobility Overal bed mobility: Needs Assistance Bed Mobility: Supine to Sit     Supine to sit: Supervision;HOB elevated     General bed mobility comments: for safety  Transfers Overall transfer level: Needs assistance Equipment used: Rolling walker (2 wheeled) Transfers: Sit to/from Stand Sit to Stand: Supervision         General transfer comment: VCs safety, technique, hand/LE placement. Supervision for safety.   Ambulation/Gait Ambulation/Gait assistance: Min guard Gait Distance (Feet): 100 Feet Assistive device: Rolling walker (2 wheeled) Gait Pattern/deviations: Step-to pattern;Decreased stance time - left     General Gait Details: VCs safety, technique, sequence. Close guard for safety.    Stairs Stairs: Yes Stairs assistance: Min guard Stair Management: Step to pattern;Forwards;Two rails Number of Stairs: 3 General stair comments: VCs safety, technique, sequence. Close guard for safety.     Wheelchair Mobility    Modified Rankin (Stroke Patients Only)       Balance                                            Cognition Arousal/Alertness: Awake/alert Behavior During Therapy: WFL for tasks assessed/performed Overall Cognitive Status: Within Functional Limits for tasks assessed                                        Exercises Total Joint Exercises Ankle Circles/Pumps: AROM;Both;Supine;15 reps Quad Sets: AROM;Both;10 reps;Supine Heel Slides: AROM;Left;10 reps;Supine Hip ABduction/ADduction: AROM;Left;10 reps;Supine Straight Leg Raises: AROM;Left;10 reps;Supine Knee Flexion: AROM;Left;10 reps;Seated Goniometric ROM: ~5-95 degrees    General Comments        Pertinent Vitals/Pain Pain Assessment: 0-10 Pain Score: 6  Pain Location: L knee Pain Descriptors / Indicators: Aching;Sore Pain Intervention(s): Monitored during session;Repositioned;Ice applied    Home Living                      Prior Function            PT Goals (current goals can now be found in the care plan section) Progress towards PT goals: Progressing toward goals    Frequency    7X/week      PT Plan Current plan remains appropriate    Co-evaluation  AM-PAC PT "6 Clicks" Daily Activity  Outcome Measure  Difficulty turning over in bed (including adjusting bedclothes, sheets and blankets)?: None Difficulty moving from lying on back to sitting on the side of the bed? : None Difficulty sitting down on and standing up from a chair with arms (e.g., wheelchair, bedside commode, etc,.)?: A Little Help needed moving to and from a bed to chair (including a wheelchair)?: A Little Help needed walking in hospital room?: A Little Help needed climbing 3-5 steps with a railing? : A Little 6 Click Score: 20    End of Session Equipment Utilized During Treatment: Gait belt Activity Tolerance: Patient tolerated treatment  well Patient left: in chair;with call bell/phone within reach   PT Visit Diagnosis: Pain;Difficulty in walking, not elsewhere classified (R26.2);Other abnormalities of gait and mobility (R26.89) Pain - Right/Left: Left Pain - part of body: Knee     Time: 1610-9604 PT Time Calculation (min) (ACUTE ONLY): 36 min  Charges:  $Gait Training: 8-22 mins $Therapeutic Exercise: 8-22 mins                        Weston Anna, PT Acute Rehabilitation Services Pager: [phone] Office: [phone]

## 2018-02-08 NOTE — Care Management Note (Signed)
Case Management Note  Patient Details  Name: Adam [name] MRN: [id number] Date of Birth: Jan 19, 1941  Subjective/Objective:    Discharge planning, spoke with patient and spouse at bedside. [facility] chosen [facility] PT, evaluate and treat.             Action/Plan: Contacted [facility] for referral. Has DME. [phone]    Expected Discharge Date:                  Expected Discharge Plan:  [place]  In-House Referral:  NA  Discharge planning Services  CM Consult  Post Acute Care Choice:  Home Health Choice offered to:  Patient  DME Arranged:  N/A DME Agency:  NA  HH Arranged:  PT [place] Agency:  [facility] (formerly [employer])  Status of Service:  Completed, signed off  If discussed at [employer], dates discussed:    Additional Comments:  Guadalupe Maple, RN 02/08/2018, 9:54 AM

## 2018-02-08 NOTE — Op Note (Signed)
TOTAL KNEE REPLACEMENT OPERATIVE NOTE:  02/07/2018  5:15 PM  PATIENT:  Adam [name]  77 y.o. male  PRE-OPERATIVE DIAGNOSIS:  primary osteoarthritis left knee  POST-OPERATIVE DIAGNOSIS:  primary osteoarthritis left knee  PROCEDURE:  Procedure(s): LEFT TOTAL KNEE ARTHROPLASTY  SURGEON:  Surgeon(s): Vickey Huger, MD  PHYSICIAN ASSISTANT: Carlyon Shadow, PA-C   ANESTHESIA:   spinal  SPECIMEN: None  COUNTS:  Correct  TOURNIQUET:   Total Tourniquet Time Documented: Thigh (Left) - 43 minutes Total: Thigh (Left) - 43 minutes   DICTATION:  Indication for procedure:    The patient is a 77 y.o. male who has failed conservative treatment for primary osteoarthritis left knee.  Informed consent was obtained prior to anesthesia. The risks versus benefits of the operation were explain and in a way the patient can, and did, understand.   On the implant demand matching protocol, this patient scored 10.  Therefore, this patient was not receive a polyethylene insert with vitamin E which is a high demand implant.  Description of procedure:     The patient was taken to the operating room and placed under anesthesia.  The patient was positioned in the usual fashion taking care that all body parts were adequately padded and/or protected.  A tourniquet was applied and the leg prepped and draped in the usual sterile fashion.  The extremity was exsanguinated with the esmarch and tourniquet inflated to 350 mmHg.  Pre-operative range of motion was normal.  The knee was in 5 degree of mild varus.  A midline incision approximately 6-7 inches long was made with a #10 blade.  A new blade was used to make a parapatellar arthrotomy going 2-3 cm into the quadriceps tendon, over the patella, and alongside the medial aspect of the patellar tendon.  A synovectomy was then performed with the #10 blade and forceps. I then elevated the deep MCL off the medial tibial metaphysis subperiosteally around to the  semimembranosus attachment.    I everted the patella and used calipers to measure patellar thickness.  I used the reamer to ream down to appropriate thickness to recreate the native thickness.  I then removed excess bone with the rongeur and sagittal saw.  I used the appropriately sized template and drilled the three lug holes.  I then put the trial in place and measured the thickness with the calipers to ensure recreation of the native thickness.  The trial was then removed and the patella subluxed and the knee brought into flexion.  A homan retractor was place to retract and protect the patella and lateral structures.  A Z-retractor was place medially to protect the medial structures.  The extra-medullary alignment system was used to make cut the tibial articular surface perpendicular to the anamotic axis of the tibia and in 3 degrees of posterior slope.  The cut surface and alignment jig was removed.  I then used the intramedullary alignment guide to make a 6 valgus cut on the distal femur.  I then marked out the epicondylar axis on the distal femur.  The posterior condylar axis measured 3 degrees.  I then used the anterior referencing sizer and measured the femur to be a size 12.  The 4-In-1 cutting block was screwed into place in external rotation matching the posterior condylar angle, making our cuts perpendicular to the epicondylar axis.  Anterior, posterior and chamfer cuts were made with the sagittal saw.  The cutting block and cut pieces were removed.  A lamina spreader was placed in  90 degrees of flexion.  The ACL, PCL, menisci, and posterior condylar osteophytes were removed.  A 11 mm spacer blocked was found to offer good flexion and extension gap balance after minimal in degree releasing.   The scoop retractor was then placed and the femoral finishing block was pinned in place.  The small sagittal saw was used as well as the lug drill to finish the femur.  The block and cut surfaces were  removed and the medullary canal hole filled with autograft bone from the cut pieces.  The tibia was delivered forward in deep flexion and external rotation.  A size G tray was selected and pinned into place centered on the medial 1/3 of the tibial tubercle.  The reamer and keel was used to prepare the tibia through the tray.    I then trialed with the size 12 femur, size G tibia, a 11 mm insert and the 38 patella.  I had excellent flexion/extension gap balance, excellent patella tracking.  Flexion was full and beyond 120 degrees; extension was zero.  These components were chosen and the staff opened them to me on the back table while the knee was lavaged copiously and the cement mixed.  The soft tissue was infiltrated with 60cc of exparel 1.3% through a 21 gauge needle.  I cemented in the components and removed all excess cement.  The polyethylene tibial component was snapped into place and the knee placed in extension while cement was hardening.  The capsule was infilltrated with a 60cc exparel/marcaine/saline mixture.   Once the cement was hard, the tourniquet was let down.  Hemostasis was obtained.  The arthrotomy was closed using a #1 stratofix running suture.  The deep soft tissues were closed with #0 vicryls and the subcuticular layer closed with #2-0 vicryl.  The skin was reapproximated and closed with 3.0 Monocryl.  The wound was covered with steristrips, aquacel dressing, and a TED stocking.   The patient was then awakened, extubated, and taken to the recovery room in stable condition.  BLOOD LOSS:  654YT COMPLICATIONS:  None.  PLAN OF CARE: Admit for overnight observation  PATIENT DISPOSITION:  PACU - hemodynamically stable.   Delay start of Pharmacological VTE agent (>24hrs) due to surgical blood loss or risk of bleeding:  not applicable  Please fax a copy of this op note to my office at [phone] (please only include page 1 and 2 of the Case Information op  note)

## 2018-02-08 NOTE — Progress Notes (Signed)
SPORTS MEDICINE AND JOINT REPLACEMENT  Lara Mulch, MD    Carlyon Shadow, PA-C [address], [place], [place]  [address]                             [phone]   PROGRESS NOTE  Subjective:  negative for Chest Pain  negative for Shortness of Breath  negative for Nausea/Vomiting   negative for Calf Pain  negative for Bowel Movement   Tolerating Diet: yes         Patient reports pain as 4 on 0-10 scale.    Objective: Vital signs in last 24 hours:    Patient Vitals for the past 24 hrs:  BP Temp Temp src Pulse Resp SpO2  02/08/18 0640 122/67 97.6 F (36.4 C) Oral 70 - 98 %  02/08/18 0225 111/74 97.9 F (36.6 C) Oral 66 17 98 %  02/08/18 0223 111/74 97.9 F (36.6 C) Oral 74 17 98 %  02/07/18 2205 118/68 98.3 F (36.8 C) Oral 71 16 99 %  02/07/18 1719 119/75 97.6 F (36.4 C) Oral 78 16 99 %  02/07/18 1338 127/83 97.7 F (36.5 C) Oral 80 14 99 %  02/07/18 1238 120/79 97.9 F (36.6 C) Oral 79 14 100 %  02/07/18 1134 129/84 98.2 F (36.8 C) Oral 76 14 100 %  02/07/18 1028 128/81 97.6 F (36.4 C) Oral 76 16 98 %  02/07/18 1015 121/78 98.3 F (36.8 C) - 83 17 100 %  02/07/18 1000 115/76 98.3 F (36.8 C) - 75 18 100 %  02/07/18 0945 116/77 - - 82 (!) 21 98 %  02/07/18 0930 115/71 - - 78 (!) 28 100 %  02/07/18 0915 114/72 - - 87 16 100 %  02/07/18 0912 108/80 97.6 F (36.4 C) - 85 16 97 %  02/07/18 0726 - - - 77 13 100 %  02/07/18 0725 137/85 - - 77 13 100 %  02/07/18 0724 - - - 76 13 100 %  02/07/18 0723 - - - 76 13 99 %  02/07/18 0722 - - - 74 13 99 %  02/07/18 0721 - - - 74 12 99 %  02/07/18 0720 135/89 - - 74 13 100 %  02/07/18 0719 - - - 76 13 99 %  02/07/18 0718 - - - 79 17 100 %  02/07/18 0717 - - - 73 11 99 %  02/07/18 0716 - - - 77 16 100 %  02/07/18 0715 (!) 139/92 - - 74 13 100 %  02/07/18 0714 - - - 78 12 99 %  02/07/18 0713 - - - 81 16 99 %  02/07/18 0712 - - - 74 12 99 %    @flow {1959:LAST@   Intake/Output from previous day:   09/23  0701 - 09/24 0700 In: 3923.8 [P.O.:660; I.V.:3163.8] Out: 1400 [Urine:1400]   Intake/Output this shift:   09/23 1901 - 09/24 0700 In: 1301.6 [P.O.:180; I.V.:1121.6] Out: 300 [Urine:300]   Intake/Output      09/23 0701 - 09/24 0700   P.O. 660   I.V. (mL/kg) 3163.8 (38.6)   IV Piggyback 100   Total Intake(mL/kg) 3923.8 (47.9)   Urine (mL/kg/hr) 1400 (0.7)   Total Output 1400   Net +2523.8          LABORATORY DATA: Recent Labs    02/08/18 0441  WBC 10.7*  HGB 10.0*  HCT 29.9*  PLT 187   Recent Labs  02/08/18 0441  NA 138  K 4.1  CL 106  CO2 26  BUN 20  CREATININE 0.98  GLUCOSE 118*  CALCIUM 8.2*   Lab Results  Component Value Date   INR 1.02 05/23/2014    Examination:  General appearance: alert, cooperative and no distress Extremities: extremities normal, atraumatic, no cyanosis or edema  Wound Exam: clean, dry, intact   Drainage:  None: wound tissue dry  Motor Exam: Quadriceps and Hamstrings Intact  Sensory Exam: Superficial Peroneal, Deep Peroneal and Tibial normal   Assessment:    1 Day Post-Op  Procedure(s) (LRB): LEFT TOTAL KNEE ARTHROPLASTY (Left)  ADDITIONAL DIAGNOSIS:  Active Problems:   S/P total knee replacement     Plan: Physical Therapy as ordered Weight Bearing as Tolerated (WBAT)  DVT Prophylaxis:  Aspirin  DISCHARGE PLAN: Home  DISCHARGE NEEDS: HHPT   Patient looks great. Anticipate D/C home with HHPT once cleared by hospital PT today       Patient's anticipated LOS is less than 2 midnights, meeting these requirements: - Lives within 1 hour of care - Has a competent adult at home to recover with post-op recover - NO history of  - Chronic pain requiring opiods  - Diabetes  - Coronary Artery Disease  - Heart failure  - Heart attack  - Stroke  - DVT/VTE  - Cardiac arrhythmia  - Respiratory Failure/COPD  - Renal failure  - Anemia  - Advanced Liver disease        Donia Ast 02/08/2018, 6:53 AM

## 2018-02-08 NOTE — Progress Notes (Signed)
Physical Therapy Treatment Patient Details Name: Adam [name] MRN: [id number] DOB: Sep 05, 1940 Today's Date: 02/08/2018    History of Present Illness 77 yo male s/p L TKA 9/21. Hx of R TKA    PT Comments    2nd session to assess pre- and post- ambulation BPs since pt had a drop in BP during a.m session. BP prior to ambulation: 137/90; BP after ambulation: 141/90. Pt denied lightheadedness, nausea. He tolerated activity well. He is eager to d/c home. Okay to d/c from PT standpoint-made RN aware.     Follow Up Recommendations  Follow surgeon's recommendation for DC plan and follow-up therapies  Supervision OOB/mobility     Equipment Recommendations  None recommended by PT    Recommendations for Other Services       Precautions / Restrictions Precautions Precautions: Fall;Knee Restrictions Weight Bearing Restrictions: No Other Position/Activity Restrictions: WBAT    Mobility  Bed Mobility               General bed mobility comments: oob in recliner  Transfers Overall transfer level: Needs assistance Equipment used: Rolling walker (2 wheeled) Transfers: Sit to/from Stand Sit to Stand: Supervision         General transfer comment: VCs safety, technique, hand/LE placement. Supervision for safety.   Ambulation/Gait Ambulation/Gait assistance: Supervision Gait Distance (Feet): 125 Feet Assistive device: Rolling walker (2 wheeled) Gait Pattern/deviations: Step-to pattern;Step-through pattern;Decreased stride length;Decreased stance time - left     General Gait Details: VCs safety, technique, sequence. Close guard for safety. Encouraged increased WBing/stance time on L.    Stairs             Wheelchair Mobility    Modified Rankin (Stroke Patients Only)       Balance                                            Cognition Arousal/Alertness: Awake/alert Behavior During Therapy: WFL for tasks assessed/performed Overall Cognitive  Status: Within Functional Limits for tasks assessed                                        Exercises Total Joint Exercises Ankle Circles/Pumps: AROM;Both;Supine;15 reps Quad Sets: AROM;Both;10 reps;Supine Heel Slides: AROM;Left;10 reps;Supine Hip ABduction/ADduction: AROM;Left;10 reps;Supine Straight Leg Raises: AROM;Left;10 reps;Supine Knee Flexion: AROM;Left;10 reps;Seated Goniometric ROM: ~5-95 degrees    General Comments        Pertinent Vitals/Pain Pain Assessment: 0-10 Pain Score: 6  Pain Location: L knee Pain Descriptors / Indicators: Aching;Sore Pain Intervention(s): Monitored during session;Repositioned    Home Living                      Prior Function            PT Goals (current goals can now be found in the care plan section) Progress towards PT goals: Progressing toward goals    Frequency    7X/week      PT Plan Current plan remains appropriate    Co-evaluation              AM-PAC PT "6 Clicks" Daily Activity  Outcome Measure  Difficulty turning over in bed (including adjusting bedclothes, sheets and blankets)?: None Difficulty moving from lying on back to sitting on the side of  the bed? : None Difficulty sitting down on and standing up from a chair with arms (e.g., wheelchair, bedside commode, etc,.)?: A Little Help needed moving to and from a bed to chair (including a wheelchair)?: A Little Help needed walking in hospital room?: A Little Help needed climbing 3-5 steps with a railing? : A Little 6 Click Score: 20    End of Session Equipment Utilized During Treatment: Gait belt Activity Tolerance: Patient tolerated treatment well Patient left: in chair;with call bell/phone within reach   PT Visit Diagnosis: Pain;Difficulty in walking, not elsewhere classified (R26.2);Other abnormalities of gait and mobility (R26.89) Pain - Right/Left: Left Pain - part of body: Knee     Time: 6578-4696 PT Time Calculation  (min) (ACUTE ONLY): 14 min  Charges:  $Gait Training: 8-22 mins $Therapeutic Exercise: 8-22 mins                        Weston Anna, PT Acute Rehabilitation Services Pager: [phone] Office: [phone]

## 2018-02-09 DIAGNOSIS — I10 Essential (primary) hypertension: Secondary | ICD-10-CM | POA: Diagnosis not present

## 2018-02-09 DIAGNOSIS — N400 Enlarged prostate without lower urinary tract symptoms: Secondary | ICD-10-CM | POA: Diagnosis not present

## 2018-02-09 DIAGNOSIS — Z87891 Personal history of nicotine dependence: Secondary | ICD-10-CM | POA: Diagnosis not present

## 2018-02-09 DIAGNOSIS — Z9181 History of falling: Secondary | ICD-10-CM | POA: Diagnosis not present

## 2018-02-09 DIAGNOSIS — Z471 Aftercare following joint replacement surgery: Secondary | ICD-10-CM | POA: Diagnosis not present

## 2018-02-09 DIAGNOSIS — Z7982 Long term (current) use of aspirin: Secondary | ICD-10-CM | POA: Diagnosis not present

## 2018-02-09 DIAGNOSIS — Z96652 Presence of left artificial knee joint: Secondary | ICD-10-CM | POA: Diagnosis not present

## 2018-02-10 DIAGNOSIS — N400 Enlarged prostate without lower urinary tract symptoms: Secondary | ICD-10-CM | POA: Diagnosis not present

## 2018-02-10 DIAGNOSIS — Z471 Aftercare following joint replacement surgery: Secondary | ICD-10-CM | POA: Diagnosis not present

## 2018-02-10 DIAGNOSIS — Z87891 Personal history of nicotine dependence: Secondary | ICD-10-CM | POA: Diagnosis not present

## 2018-02-10 DIAGNOSIS — Z96652 Presence of left artificial knee joint: Secondary | ICD-10-CM | POA: Diagnosis not present

## 2018-02-10 DIAGNOSIS — I10 Essential (primary) hypertension: Secondary | ICD-10-CM | POA: Diagnosis not present

## 2018-02-10 DIAGNOSIS — Z7982 Long term (current) use of aspirin: Secondary | ICD-10-CM | POA: Diagnosis not present

## 2018-02-11 DIAGNOSIS — N400 Enlarged prostate without lower urinary tract symptoms: Secondary | ICD-10-CM | POA: Diagnosis not present

## 2018-02-11 DIAGNOSIS — Z7982 Long term (current) use of aspirin: Secondary | ICD-10-CM | POA: Diagnosis not present

## 2018-02-11 DIAGNOSIS — Z96652 Presence of left artificial knee joint: Secondary | ICD-10-CM | POA: Diagnosis not present

## 2018-02-11 DIAGNOSIS — Z87891 Personal history of nicotine dependence: Secondary | ICD-10-CM | POA: Diagnosis not present

## 2018-02-11 DIAGNOSIS — I10 Essential (primary) hypertension: Secondary | ICD-10-CM | POA: Diagnosis not present

## 2018-02-11 DIAGNOSIS — Z471 Aftercare following joint replacement surgery: Secondary | ICD-10-CM | POA: Diagnosis not present

## 2018-02-14 DIAGNOSIS — Z96652 Presence of left artificial knee joint: Secondary | ICD-10-CM | POA: Diagnosis not present

## 2018-02-14 DIAGNOSIS — Z471 Aftercare following joint replacement surgery: Secondary | ICD-10-CM | POA: Diagnosis not present

## 2018-02-14 DIAGNOSIS — N400 Enlarged prostate without lower urinary tract symptoms: Secondary | ICD-10-CM | POA: Diagnosis not present

## 2018-02-14 DIAGNOSIS — I10 Essential (primary) hypertension: Secondary | ICD-10-CM | POA: Diagnosis not present

## 2018-02-14 DIAGNOSIS — Z87891 Personal history of nicotine dependence: Secondary | ICD-10-CM | POA: Diagnosis not present

## 2018-02-14 DIAGNOSIS — Z7982 Long term (current) use of aspirin: Secondary | ICD-10-CM | POA: Diagnosis not present

## 2018-02-16 DIAGNOSIS — Z471 Aftercare following joint replacement surgery: Secondary | ICD-10-CM | POA: Diagnosis not present

## 2018-02-16 DIAGNOSIS — N400 Enlarged prostate without lower urinary tract symptoms: Secondary | ICD-10-CM | POA: Diagnosis not present

## 2018-02-16 DIAGNOSIS — Z7982 Long term (current) use of aspirin: Secondary | ICD-10-CM | POA: Diagnosis not present

## 2018-02-16 DIAGNOSIS — I10 Essential (primary) hypertension: Secondary | ICD-10-CM | POA: Diagnosis not present

## 2018-02-16 DIAGNOSIS — Z96652 Presence of left artificial knee joint: Secondary | ICD-10-CM | POA: Diagnosis not present

## 2018-02-16 DIAGNOSIS — Z87891 Personal history of nicotine dependence: Secondary | ICD-10-CM | POA: Diagnosis not present

## 2018-02-17 DIAGNOSIS — M62552 Muscle wasting and atrophy, not elsewhere classified, left thigh: Secondary | ICD-10-CM | POA: Diagnosis not present

## 2018-02-17 DIAGNOSIS — M25562 Pain in left knee: Secondary | ICD-10-CM | POA: Diagnosis not present

## 2018-02-17 DIAGNOSIS — Z96652 Presence of left artificial knee joint: Secondary | ICD-10-CM | POA: Diagnosis not present

## 2018-02-17 DIAGNOSIS — R2689 Other abnormalities of gait and mobility: Secondary | ICD-10-CM | POA: Diagnosis not present

## 2018-02-17 DIAGNOSIS — M25462 Effusion, left knee: Secondary | ICD-10-CM | POA: Diagnosis not present

## 2018-02-18 DIAGNOSIS — Z1331 Encounter for screening for depression: Secondary | ICD-10-CM | POA: Diagnosis not present

## 2018-02-18 DIAGNOSIS — Z6823 Body mass index (BMI) 23.0-23.9, adult: Secondary | ICD-10-CM | POA: Diagnosis not present

## 2018-02-18 DIAGNOSIS — Z9181 History of falling: Secondary | ICD-10-CM | POA: Diagnosis not present

## 2018-02-18 DIAGNOSIS — I10 Essential (primary) hypertension: Secondary | ICD-10-CM | POA: Diagnosis not present

## 2018-02-21 DIAGNOSIS — M25562 Pain in left knee: Secondary | ICD-10-CM | POA: Diagnosis not present

## 2018-02-21 DIAGNOSIS — M62552 Muscle wasting and atrophy, not elsewhere classified, left thigh: Secondary | ICD-10-CM | POA: Diagnosis not present

## 2018-02-21 DIAGNOSIS — R2689 Other abnormalities of gait and mobility: Secondary | ICD-10-CM | POA: Diagnosis not present

## 2018-02-21 DIAGNOSIS — Z96652 Presence of left artificial knee joint: Secondary | ICD-10-CM | POA: Diagnosis not present

## 2018-02-21 DIAGNOSIS — M25462 Effusion, left knee: Secondary | ICD-10-CM | POA: Diagnosis not present

## 2018-02-23 DIAGNOSIS — M62552 Muscle wasting and atrophy, not elsewhere classified, left thigh: Secondary | ICD-10-CM | POA: Diagnosis not present

## 2018-02-23 DIAGNOSIS — M25462 Effusion, left knee: Secondary | ICD-10-CM | POA: Diagnosis not present

## 2018-02-23 DIAGNOSIS — R2689 Other abnormalities of gait and mobility: Secondary | ICD-10-CM | POA: Diagnosis not present

## 2018-02-23 DIAGNOSIS — Z96652 Presence of left artificial knee joint: Secondary | ICD-10-CM | POA: Diagnosis not present

## 2018-02-23 DIAGNOSIS — M25562 Pain in left knee: Secondary | ICD-10-CM | POA: Diagnosis not present

## 2018-02-25 DIAGNOSIS — M25462 Effusion, left knee: Secondary | ICD-10-CM | POA: Diagnosis not present

## 2018-02-25 DIAGNOSIS — R2689 Other abnormalities of gait and mobility: Secondary | ICD-10-CM | POA: Diagnosis not present

## 2018-02-25 DIAGNOSIS — M25562 Pain in left knee: Secondary | ICD-10-CM | POA: Diagnosis not present

## 2018-02-25 DIAGNOSIS — M62552 Muscle wasting and atrophy, not elsewhere classified, left thigh: Secondary | ICD-10-CM | POA: Diagnosis not present

## 2018-02-25 DIAGNOSIS — Z96652 Presence of left artificial knee joint: Secondary | ICD-10-CM | POA: Diagnosis not present

## 2018-02-28 DIAGNOSIS — R2689 Other abnormalities of gait and mobility: Secondary | ICD-10-CM | POA: Diagnosis not present

## 2018-02-28 DIAGNOSIS — M25462 Effusion, left knee: Secondary | ICD-10-CM | POA: Diagnosis not present

## 2018-02-28 DIAGNOSIS — M25562 Pain in left knee: Secondary | ICD-10-CM | POA: Diagnosis not present

## 2018-02-28 DIAGNOSIS — Z96652 Presence of left artificial knee joint: Secondary | ICD-10-CM | POA: Diagnosis not present

## 2018-02-28 DIAGNOSIS — M62552 Muscle wasting and atrophy, not elsewhere classified, left thigh: Secondary | ICD-10-CM | POA: Diagnosis not present

## 2018-03-02 DIAGNOSIS — Z96652 Presence of left artificial knee joint: Secondary | ICD-10-CM | POA: Diagnosis not present

## 2018-03-02 DIAGNOSIS — M25562 Pain in left knee: Secondary | ICD-10-CM | POA: Diagnosis not present

## 2018-03-02 DIAGNOSIS — M25462 Effusion, left knee: Secondary | ICD-10-CM | POA: Diagnosis not present

## 2018-03-02 DIAGNOSIS — R2689 Other abnormalities of gait and mobility: Secondary | ICD-10-CM | POA: Diagnosis not present

## 2018-03-02 DIAGNOSIS — M62552 Muscle wasting and atrophy, not elsewhere classified, left thigh: Secondary | ICD-10-CM | POA: Diagnosis not present

## 2018-03-04 DIAGNOSIS — R2689 Other abnormalities of gait and mobility: Secondary | ICD-10-CM | POA: Diagnosis not present

## 2018-03-04 DIAGNOSIS — Z96652 Presence of left artificial knee joint: Secondary | ICD-10-CM | POA: Diagnosis not present

## 2018-03-04 DIAGNOSIS — M62552 Muscle wasting and atrophy, not elsewhere classified, left thigh: Secondary | ICD-10-CM | POA: Diagnosis not present

## 2018-03-04 DIAGNOSIS — M25562 Pain in left knee: Secondary | ICD-10-CM | POA: Diagnosis not present

## 2018-03-04 DIAGNOSIS — M25462 Effusion, left knee: Secondary | ICD-10-CM | POA: Diagnosis not present

## 2018-03-07 DIAGNOSIS — R2689 Other abnormalities of gait and mobility: Secondary | ICD-10-CM | POA: Diagnosis not present

## 2018-03-07 DIAGNOSIS — M25462 Effusion, left knee: Secondary | ICD-10-CM | POA: Diagnosis not present

## 2018-03-07 DIAGNOSIS — M62552 Muscle wasting and atrophy, not elsewhere classified, left thigh: Secondary | ICD-10-CM | POA: Diagnosis not present

## 2018-03-07 DIAGNOSIS — Z96652 Presence of left artificial knee joint: Secondary | ICD-10-CM | POA: Diagnosis not present

## 2018-03-07 DIAGNOSIS — M25562 Pain in left knee: Secondary | ICD-10-CM | POA: Diagnosis not present

## 2018-03-09 DIAGNOSIS — M25462 Effusion, left knee: Secondary | ICD-10-CM | POA: Diagnosis not present

## 2018-03-09 DIAGNOSIS — M25562 Pain in left knee: Secondary | ICD-10-CM | POA: Diagnosis not present

## 2018-03-09 DIAGNOSIS — R2689 Other abnormalities of gait and mobility: Secondary | ICD-10-CM | POA: Diagnosis not present

## 2018-03-09 DIAGNOSIS — M62552 Muscle wasting and atrophy, not elsewhere classified, left thigh: Secondary | ICD-10-CM | POA: Diagnosis not present

## 2018-03-09 DIAGNOSIS — Z96652 Presence of left artificial knee joint: Secondary | ICD-10-CM | POA: Diagnosis not present

## 2018-03-11 DIAGNOSIS — R2689 Other abnormalities of gait and mobility: Secondary | ICD-10-CM | POA: Diagnosis not present

## 2018-03-11 DIAGNOSIS — M62552 Muscle wasting and atrophy, not elsewhere classified, left thigh: Secondary | ICD-10-CM | POA: Diagnosis not present

## 2018-03-11 DIAGNOSIS — M25462 Effusion, left knee: Secondary | ICD-10-CM | POA: Diagnosis not present

## 2018-03-11 DIAGNOSIS — M25562 Pain in left knee: Secondary | ICD-10-CM | POA: Diagnosis not present

## 2018-03-11 DIAGNOSIS — Z96652 Presence of left artificial knee joint: Secondary | ICD-10-CM | POA: Diagnosis not present

## 2018-03-14 DIAGNOSIS — M25562 Pain in left knee: Secondary | ICD-10-CM | POA: Diagnosis not present

## 2018-03-14 DIAGNOSIS — Z96652 Presence of left artificial knee joint: Secondary | ICD-10-CM | POA: Diagnosis not present

## 2018-03-14 DIAGNOSIS — M62552 Muscle wasting and atrophy, not elsewhere classified, left thigh: Secondary | ICD-10-CM | POA: Diagnosis not present

## 2018-03-14 DIAGNOSIS — R2689 Other abnormalities of gait and mobility: Secondary | ICD-10-CM | POA: Diagnosis not present

## 2018-03-14 DIAGNOSIS — M25462 Effusion, left knee: Secondary | ICD-10-CM | POA: Diagnosis not present

## 2018-03-16 DIAGNOSIS — R2689 Other abnormalities of gait and mobility: Secondary | ICD-10-CM | POA: Diagnosis not present

## 2018-03-16 DIAGNOSIS — M62552 Muscle wasting and atrophy, not elsewhere classified, left thigh: Secondary | ICD-10-CM | POA: Diagnosis not present

## 2018-03-16 DIAGNOSIS — Z96652 Presence of left artificial knee joint: Secondary | ICD-10-CM | POA: Diagnosis not present

## 2018-03-16 DIAGNOSIS — M25562 Pain in left knee: Secondary | ICD-10-CM | POA: Diagnosis not present

## 2018-03-16 DIAGNOSIS — M25462 Effusion, left knee: Secondary | ICD-10-CM | POA: Diagnosis not present

## 2018-03-18 DIAGNOSIS — M25562 Pain in left knee: Secondary | ICD-10-CM | POA: Diagnosis not present

## 2018-03-18 DIAGNOSIS — Z96652 Presence of left artificial knee joint: Secondary | ICD-10-CM | POA: Diagnosis not present

## 2018-03-18 DIAGNOSIS — R2689 Other abnormalities of gait and mobility: Secondary | ICD-10-CM | POA: Diagnosis not present

## 2018-03-21 DIAGNOSIS — R2689 Other abnormalities of gait and mobility: Secondary | ICD-10-CM | POA: Diagnosis not present

## 2018-03-21 DIAGNOSIS — Z96652 Presence of left artificial knee joint: Secondary | ICD-10-CM | POA: Diagnosis not present

## 2018-03-21 DIAGNOSIS — M25462 Effusion, left knee: Secondary | ICD-10-CM | POA: Diagnosis not present

## 2018-03-21 DIAGNOSIS — M25562 Pain in left knee: Secondary | ICD-10-CM | POA: Diagnosis not present

## 2018-03-21 DIAGNOSIS — M62552 Muscle wasting and atrophy, not elsewhere classified, left thigh: Secondary | ICD-10-CM | POA: Diagnosis not present

## 2018-03-23 DIAGNOSIS — M62552 Muscle wasting and atrophy, not elsewhere classified, left thigh: Secondary | ICD-10-CM | POA: Diagnosis not present

## 2018-03-23 DIAGNOSIS — R2689 Other abnormalities of gait and mobility: Secondary | ICD-10-CM | POA: Diagnosis not present

## 2018-03-23 DIAGNOSIS — Z96652 Presence of left artificial knee joint: Secondary | ICD-10-CM | POA: Diagnosis not present

## 2018-03-23 DIAGNOSIS — M25562 Pain in left knee: Secondary | ICD-10-CM | POA: Diagnosis not present

## 2018-03-23 DIAGNOSIS — M25462 Effusion, left knee: Secondary | ICD-10-CM | POA: Diagnosis not present

## 2018-03-25 DIAGNOSIS — M25462 Effusion, left knee: Secondary | ICD-10-CM | POA: Diagnosis not present

## 2018-03-25 DIAGNOSIS — M62552 Muscle wasting and atrophy, not elsewhere classified, left thigh: Secondary | ICD-10-CM | POA: Diagnosis not present

## 2018-03-25 DIAGNOSIS — M25562 Pain in left knee: Secondary | ICD-10-CM | POA: Diagnosis not present

## 2018-03-25 DIAGNOSIS — R2689 Other abnormalities of gait and mobility: Secondary | ICD-10-CM | POA: Diagnosis not present

## 2018-03-25 DIAGNOSIS — Z96652 Presence of left artificial knee joint: Secondary | ICD-10-CM | POA: Diagnosis not present

## 2018-03-28 DIAGNOSIS — R2689 Other abnormalities of gait and mobility: Secondary | ICD-10-CM | POA: Diagnosis not present

## 2018-03-28 DIAGNOSIS — M25462 Effusion, left knee: Secondary | ICD-10-CM | POA: Diagnosis not present

## 2018-03-28 DIAGNOSIS — M62552 Muscle wasting and atrophy, not elsewhere classified, left thigh: Secondary | ICD-10-CM | POA: Diagnosis not present

## 2018-03-28 DIAGNOSIS — Z96652 Presence of left artificial knee joint: Secondary | ICD-10-CM | POA: Diagnosis not present

## 2018-03-28 DIAGNOSIS — M25562 Pain in left knee: Secondary | ICD-10-CM | POA: Diagnosis not present

## 2018-03-31 DIAGNOSIS — R2689 Other abnormalities of gait and mobility: Secondary | ICD-10-CM | POA: Diagnosis not present

## 2018-03-31 DIAGNOSIS — M25562 Pain in left knee: Secondary | ICD-10-CM | POA: Diagnosis not present

## 2018-03-31 DIAGNOSIS — M25462 Effusion, left knee: Secondary | ICD-10-CM | POA: Diagnosis not present

## 2018-03-31 DIAGNOSIS — Z96652 Presence of left artificial knee joint: Secondary | ICD-10-CM | POA: Diagnosis not present

## 2018-03-31 DIAGNOSIS — M62552 Muscle wasting and atrophy, not elsewhere classified, left thigh: Secondary | ICD-10-CM | POA: Diagnosis not present

## 2018-06-09 DIAGNOSIS — C4441 Basal cell carcinoma of skin of scalp and neck: Secondary | ICD-10-CM | POA: Diagnosis not present

## 2018-06-09 DIAGNOSIS — Z96652 Presence of left artificial knee joint: Secondary | ICD-10-CM | POA: Diagnosis not present

## 2018-06-09 DIAGNOSIS — D2239 Melanocytic nevi of other parts of face: Secondary | ICD-10-CM | POA: Diagnosis not present

## 2018-06-21 DIAGNOSIS — C4442 Squamous cell carcinoma of skin of scalp and neck: Secondary | ICD-10-CM | POA: Diagnosis not present

## 2018-07-05 DIAGNOSIS — M25462 Effusion, left knee: Secondary | ICD-10-CM | POA: Diagnosis not present

## 2018-07-05 DIAGNOSIS — Z96652 Presence of left artificial knee joint: Secondary | ICD-10-CM | POA: Diagnosis not present

## 2018-09-27 DIAGNOSIS — Z96652 Presence of left artificial knee joint: Secondary | ICD-10-CM | POA: Diagnosis not present

## 2019-02-07 DIAGNOSIS — I10 Essential (primary) hypertension: Secondary | ICD-10-CM | POA: Diagnosis not present

## 2019-02-07 DIAGNOSIS — Z2821 Immunization not carried out because of patient refusal: Secondary | ICD-10-CM | POA: Diagnosis not present

## 2019-02-07 DIAGNOSIS — Z6823 Body mass index (BMI) 23.0-23.9, adult: Secondary | ICD-10-CM | POA: Diagnosis not present

## 2019-02-07 DIAGNOSIS — J3489 Other specified disorders of nose and nasal sinuses: Secondary | ICD-10-CM | POA: Diagnosis not present

## 2019-02-14 DIAGNOSIS — J300 Vasomotor rhinitis: Secondary | ICD-10-CM | POA: Diagnosis not present

## 2019-03-13 ENCOUNTER — Encounter (INDEPENDENT_AMBULATORY_CARE_PROVIDER_SITE_OTHER): Payer: Self-pay

## 2019-04-27 DIAGNOSIS — Z6823 Body mass index (BMI) 23.0-23.9, adult: Secondary | ICD-10-CM | POA: Diagnosis not present

## 2019-04-27 DIAGNOSIS — Z9181 History of falling: Secondary | ICD-10-CM | POA: Diagnosis not present

## 2019-04-27 DIAGNOSIS — Z23 Encounter for immunization: Secondary | ICD-10-CM | POA: Diagnosis not present

## 2019-04-27 DIAGNOSIS — Z1331 Encounter for screening for depression: Secondary | ICD-10-CM | POA: Diagnosis not present

## 2019-04-27 DIAGNOSIS — I10 Essential (primary) hypertension: Secondary | ICD-10-CM | POA: Diagnosis not present

## 2019-04-27 DIAGNOSIS — Z0000 Encounter for general adult medical examination without abnormal findings: Secondary | ICD-10-CM | POA: Diagnosis not present

## 2020-01-02 DIAGNOSIS — Z23 Encounter for immunization: Secondary | ICD-10-CM | POA: Diagnosis not present

## 2020-01-30 DIAGNOSIS — Z23 Encounter for immunization: Secondary | ICD-10-CM | POA: Diagnosis not present

## 2020-07-09 DIAGNOSIS — Z0000 Encounter for general adult medical examination without abnormal findings: Secondary | ICD-10-CM | POA: Diagnosis not present

## 2020-07-09 DIAGNOSIS — Z13220 Encounter for screening for lipoid disorders: Secondary | ICD-10-CM | POA: Diagnosis not present

## 2020-07-09 DIAGNOSIS — Z6823 Body mass index (BMI) 23.0-23.9, adult: Secondary | ICD-10-CM | POA: Diagnosis not present

## 2020-07-09 DIAGNOSIS — I10 Essential (primary) hypertension: Secondary | ICD-10-CM | POA: Diagnosis not present

## 2020-07-09 DIAGNOSIS — R5383 Other fatigue: Secondary | ICD-10-CM | POA: Diagnosis not present

## 2020-07-09 DIAGNOSIS — Z79899 Other long term (current) drug therapy: Secondary | ICD-10-CM | POA: Diagnosis not present

## 2020-12-17 DIAGNOSIS — Z20822 Contact with and (suspected) exposure to covid-19: Secondary | ICD-10-CM | POA: Diagnosis not present

## 2021-01-04 DIAGNOSIS — Z1152 Encounter for screening for COVID-19: Secondary | ICD-10-CM | POA: Diagnosis not present

## 2021-07-22 DIAGNOSIS — Z79899 Other long term (current) drug therapy: Secondary | ICD-10-CM | POA: Diagnosis not present

## 2021-07-22 DIAGNOSIS — Z1331 Encounter for screening for depression: Secondary | ICD-10-CM | POA: Diagnosis not present

## 2021-07-22 DIAGNOSIS — M1990 Unspecified osteoarthritis, unspecified site: Secondary | ICD-10-CM | POA: Diagnosis not present

## 2021-07-22 DIAGNOSIS — Z6823 Body mass index (BMI) 23.0-23.9, adult: Secondary | ICD-10-CM | POA: Diagnosis not present

## 2021-07-22 DIAGNOSIS — I10 Essential (primary) hypertension: Secondary | ICD-10-CM | POA: Diagnosis not present

## 2021-07-22 DIAGNOSIS — Z0000 Encounter for general adult medical examination without abnormal findings: Secondary | ICD-10-CM | POA: Diagnosis not present

## 2021-07-25 DIAGNOSIS — Z20822 Contact with and (suspected) exposure to covid-19: Secondary | ICD-10-CM | POA: Diagnosis not present

## 2021-07-29 DIAGNOSIS — Z1152 Encounter for screening for COVID-19: Secondary | ICD-10-CM | POA: Diagnosis not present

## 2021-07-29 DIAGNOSIS — Z20828 Contact with and (suspected) exposure to other viral communicable diseases: Secondary | ICD-10-CM | POA: Diagnosis not present

## 2021-08-05 DIAGNOSIS — Z20822 Contact with and (suspected) exposure to covid-19: Secondary | ICD-10-CM | POA: Diagnosis not present

## 2021-08-25 DIAGNOSIS — Z20822 Contact with and (suspected) exposure to covid-19: Secondary | ICD-10-CM | POA: Diagnosis not present

## 2021-09-15 DIAGNOSIS — Z20822 Contact with and (suspected) exposure to covid-19: Secondary | ICD-10-CM | POA: Diagnosis not present

## 2022-02-10 DIAGNOSIS — G8191 Hemiplegia, unspecified affecting right dominant side: Secondary | ICD-10-CM | POA: Diagnosis not present

## 2022-02-10 DIAGNOSIS — I6201 Nontraumatic acute subdural hemorrhage: Secondary | ICD-10-CM | POA: Diagnosis not present

## 2022-02-10 DIAGNOSIS — R519 Headache, unspecified: Secondary | ICD-10-CM | POA: Diagnosis not present

## 2022-02-10 DIAGNOSIS — I629 Nontraumatic intracranial hemorrhage, unspecified: Secondary | ICD-10-CM | POA: Diagnosis not present

## 2022-02-10 DIAGNOSIS — I10 Essential (primary) hypertension: Secondary | ICD-10-CM | POA: Diagnosis not present

## 2022-02-10 DIAGNOSIS — E785 Hyperlipidemia, unspecified: Secondary | ICD-10-CM | POA: Diagnosis not present

## 2022-02-10 DIAGNOSIS — S065X0A Traumatic subdural hemorrhage without loss of consciousness, initial encounter: Secondary | ICD-10-CM | POA: Diagnosis not present

## 2022-02-10 DIAGNOSIS — I6200 Nontraumatic subdural hemorrhage, unspecified: Secondary | ICD-10-CM | POA: Diagnosis not present

## 2022-02-10 DIAGNOSIS — Z5189 Encounter for other specified aftercare: Secondary | ICD-10-CM | POA: Diagnosis not present

## 2022-02-10 DIAGNOSIS — R55 Syncope and collapse: Secondary | ICD-10-CM | POA: Diagnosis not present

## 2022-02-10 DIAGNOSIS — Z79899 Other long term (current) drug therapy: Secondary | ICD-10-CM | POA: Diagnosis not present

## 2022-02-10 DIAGNOSIS — G8194 Hemiplegia, unspecified affecting left nondominant side: Secondary | ICD-10-CM | POA: Diagnosis present

## 2022-02-10 DIAGNOSIS — I6203 Nontraumatic chronic subdural hemorrhage: Secondary | ICD-10-CM | POA: Diagnosis not present

## 2022-02-10 DIAGNOSIS — G935 Compression of brain: Secondary | ICD-10-CM | POA: Diagnosis not present

## 2022-02-10 DIAGNOSIS — R2689 Other abnormalities of gait and mobility: Secondary | ICD-10-CM | POA: Diagnosis not present

## 2022-02-10 DIAGNOSIS — I6202 Nontraumatic subacute subdural hemorrhage: Secondary | ICD-10-CM | POA: Diagnosis not present

## 2022-02-10 DIAGNOSIS — Z741 Need for assistance with personal care: Secondary | ICD-10-CM | POA: Diagnosis not present

## 2022-02-10 DIAGNOSIS — S065XAA Traumatic subdural hemorrhage with loss of consciousness status unknown, initial encounter: Secondary | ICD-10-CM | POA: Diagnosis not present

## 2022-02-11 DIAGNOSIS — I6200 Nontraumatic subdural hemorrhage, unspecified: Secondary | ICD-10-CM | POA: Diagnosis not present

## 2022-02-11 DIAGNOSIS — S065XAA Traumatic subdural hemorrhage with loss of consciousness status unknown, initial encounter: Secondary | ICD-10-CM | POA: Diagnosis not present

## 2022-02-11 DIAGNOSIS — E785 Hyperlipidemia, unspecified: Secondary | ICD-10-CM | POA: Diagnosis not present

## 2022-02-11 DIAGNOSIS — I10 Essential (primary) hypertension: Secondary | ICD-10-CM | POA: Diagnosis not present

## 2022-02-12 DIAGNOSIS — S065XAA Traumatic subdural hemorrhage with loss of consciousness status unknown, initial encounter: Secondary | ICD-10-CM | POA: Diagnosis not present

## 2022-02-12 DIAGNOSIS — I6200 Nontraumatic subdural hemorrhage, unspecified: Secondary | ICD-10-CM | POA: Diagnosis not present

## 2022-02-12 DIAGNOSIS — E785 Hyperlipidemia, unspecified: Secondary | ICD-10-CM | POA: Diagnosis not present

## 2022-02-12 DIAGNOSIS — I10 Essential (primary) hypertension: Secondary | ICD-10-CM | POA: Diagnosis not present

## 2022-02-13 DIAGNOSIS — I10 Essential (primary) hypertension: Secondary | ICD-10-CM | POA: Diagnosis not present

## 2022-02-13 DIAGNOSIS — E785 Hyperlipidemia, unspecified: Secondary | ICD-10-CM | POA: Diagnosis not present

## 2022-02-13 DIAGNOSIS — I6200 Nontraumatic subdural hemorrhage, unspecified: Secondary | ICD-10-CM | POA: Diagnosis not present

## 2022-02-13 DIAGNOSIS — S065XAA Traumatic subdural hemorrhage with loss of consciousness status unknown, initial encounter: Secondary | ICD-10-CM | POA: Diagnosis not present

## 2022-02-18 DIAGNOSIS — Z6823 Body mass index (BMI) 23.0-23.9, adult: Secondary | ICD-10-CM | POA: Diagnosis not present

## 2022-02-18 DIAGNOSIS — R2689 Other abnormalities of gait and mobility: Secondary | ICD-10-CM | POA: Diagnosis present

## 2022-02-18 DIAGNOSIS — S065XAA Traumatic subdural hemorrhage with loss of consciousness status unknown, initial encounter: Secondary | ICD-10-CM | POA: Diagnosis not present

## 2022-02-18 DIAGNOSIS — N390 Urinary tract infection, site not specified: Secondary | ICD-10-CM | POA: Diagnosis not present

## 2022-02-18 DIAGNOSIS — Z87828 Personal history of other (healed) physical injury and trauma: Secondary | ICD-10-CM | POA: Diagnosis not present

## 2022-02-18 DIAGNOSIS — N189 Chronic kidney disease, unspecified: Secondary | ICD-10-CM | POA: Diagnosis present

## 2022-02-18 DIAGNOSIS — S06A0XA Traumatic brain compression without herniation, initial encounter: Secondary | ICD-10-CM | POA: Diagnosis not present

## 2022-02-18 DIAGNOSIS — I493 Ventricular premature depolarization: Secondary | ICD-10-CM | POA: Diagnosis not present

## 2022-02-18 DIAGNOSIS — Z9181 History of falling: Secondary | ICD-10-CM | POA: Diagnosis not present

## 2022-02-18 DIAGNOSIS — I6200 Nontraumatic subdural hemorrhage, unspecified: Secondary | ICD-10-CM | POA: Diagnosis not present

## 2022-02-18 DIAGNOSIS — G9389 Other specified disorders of brain: Secondary | ICD-10-CM | POA: Diagnosis not present

## 2022-02-18 DIAGNOSIS — E785 Hyperlipidemia, unspecified: Secondary | ICD-10-CM | POA: Diagnosis present

## 2022-02-18 DIAGNOSIS — G8191 Hemiplegia, unspecified affecting right dominant side: Secondary | ICD-10-CM | POA: Diagnosis not present

## 2022-02-18 DIAGNOSIS — I10 Essential (primary) hypertension: Secondary | ICD-10-CM | POA: Diagnosis not present

## 2022-02-18 DIAGNOSIS — I129 Hypertensive chronic kidney disease with stage 1 through stage 4 chronic kidney disease, or unspecified chronic kidney disease: Secondary | ICD-10-CM | POA: Diagnosis not present

## 2022-02-18 DIAGNOSIS — G935 Compression of brain: Secondary | ICD-10-CM | POA: Diagnosis not present

## 2022-02-18 DIAGNOSIS — Z9660 Presence of unspecified orthopedic joint implant: Secondary | ICD-10-CM | POA: Diagnosis present

## 2022-02-18 DIAGNOSIS — R402143 Coma scale, eyes open, spontaneous, at hospital admission: Secondary | ICD-10-CM | POA: Diagnosis not present

## 2022-02-18 DIAGNOSIS — R0902 Hypoxemia: Secondary | ICD-10-CM | POA: Diagnosis not present

## 2022-02-18 DIAGNOSIS — Z1152 Encounter for screening for COVID-19: Secondary | ICD-10-CM | POA: Diagnosis not present

## 2022-02-18 DIAGNOSIS — Z48811 Encounter for surgical aftercare following surgery on the nervous system: Secondary | ICD-10-CM | POA: Diagnosis not present

## 2022-02-18 DIAGNOSIS — R402363 Coma scale, best motor response, obeys commands, at hospital admission: Secondary | ICD-10-CM | POA: Diagnosis not present

## 2022-02-18 DIAGNOSIS — Z5189 Encounter for other specified aftercare: Secondary | ICD-10-CM | POA: Diagnosis not present

## 2022-02-18 DIAGNOSIS — Z9889 Other specified postprocedural states: Secondary | ICD-10-CM | POA: Diagnosis not present

## 2022-02-18 DIAGNOSIS — R319 Hematuria, unspecified: Secondary | ICD-10-CM | POA: Diagnosis not present

## 2022-02-18 DIAGNOSIS — R509 Fever, unspecified: Secondary | ICD-10-CM | POA: Diagnosis not present

## 2022-02-18 DIAGNOSIS — Z7409 Other reduced mobility: Secondary | ICD-10-CM | POA: Diagnosis not present

## 2022-02-18 DIAGNOSIS — Z79899 Other long term (current) drug therapy: Secondary | ICD-10-CM | POA: Diagnosis not present

## 2022-02-18 DIAGNOSIS — R519 Headache, unspecified: Secondary | ICD-10-CM | POA: Diagnosis present

## 2022-02-18 DIAGNOSIS — Z4682 Encounter for fitting and adjustment of non-vascular catheter: Secondary | ICD-10-CM | POA: Diagnosis not present

## 2022-02-18 DIAGNOSIS — R402253 Coma scale, best verbal response, oriented, at hospital admission: Secondary | ICD-10-CM | POA: Diagnosis present

## 2022-02-18 DIAGNOSIS — N3001 Acute cystitis with hematuria: Secondary | ICD-10-CM | POA: Diagnosis not present

## 2022-02-18 DIAGNOSIS — Z982 Presence of cerebrospinal fluid drainage device: Secondary | ICD-10-CM | POA: Diagnosis not present

## 2022-02-18 DIAGNOSIS — R531 Weakness: Secondary | ICD-10-CM | POA: Diagnosis not present

## 2022-02-18 DIAGNOSIS — D6489 Other specified anemias: Secondary | ICD-10-CM | POA: Diagnosis present

## 2022-02-18 DIAGNOSIS — S065X0A Traumatic subdural hemorrhage without loss of consciousness, initial encounter: Secondary | ICD-10-CM | POA: Diagnosis not present

## 2022-02-18 DIAGNOSIS — I6202 Nontraumatic subacute subdural hemorrhage: Secondary | ICD-10-CM | POA: Diagnosis not present

## 2022-02-18 DIAGNOSIS — Z741 Need for assistance with personal care: Secondary | ICD-10-CM | POA: Diagnosis not present

## 2022-02-18 DIAGNOSIS — D62 Acute posthemorrhagic anemia: Secondary | ICD-10-CM | POA: Diagnosis not present

## 2022-02-18 DIAGNOSIS — I621 Nontraumatic extradural hemorrhage: Secondary | ICD-10-CM | POA: Diagnosis not present

## 2022-02-18 DIAGNOSIS — Z452 Encounter for adjustment and management of vascular access device: Secondary | ICD-10-CM | POA: Diagnosis not present

## 2022-02-18 DIAGNOSIS — S064XAA Epidural hemorrhage with loss of consciousness status unknown, initial encounter: Secondary | ICD-10-CM | POA: Diagnosis not present

## 2022-02-25 DIAGNOSIS — Z7409 Other reduced mobility: Secondary | ICD-10-CM | POA: Diagnosis not present

## 2022-02-25 DIAGNOSIS — Z5189 Encounter for other specified aftercare: Secondary | ICD-10-CM | POA: Diagnosis not present

## 2022-02-25 DIAGNOSIS — I6202 Nontraumatic subacute subdural hemorrhage: Secondary | ICD-10-CM | POA: Diagnosis not present

## 2022-02-25 DIAGNOSIS — Z79899 Other long term (current) drug therapy: Secondary | ICD-10-CM | POA: Diagnosis not present

## 2022-02-25 DIAGNOSIS — S065X0A Traumatic subdural hemorrhage without loss of consciousness, initial encounter: Secondary | ICD-10-CM | POA: Diagnosis not present

## 2022-02-25 DIAGNOSIS — G8191 Hemiplegia, unspecified affecting right dominant side: Secondary | ICD-10-CM | POA: Diagnosis not present

## 2022-02-25 DIAGNOSIS — Z741 Need for assistance with personal care: Secondary | ICD-10-CM | POA: Diagnosis not present

## 2022-02-25 DIAGNOSIS — R531 Weakness: Secondary | ICD-10-CM | POA: Diagnosis not present

## 2022-02-25 DIAGNOSIS — I10 Essential (primary) hypertension: Secondary | ICD-10-CM | POA: Diagnosis not present

## 2022-02-25 DIAGNOSIS — Z48811 Encounter for surgical aftercare following surgery on the nervous system: Secondary | ICD-10-CM | POA: Diagnosis not present

## 2022-02-25 DIAGNOSIS — E785 Hyperlipidemia, unspecified: Secondary | ICD-10-CM | POA: Diagnosis not present

## 2022-02-25 DIAGNOSIS — Z9889 Other specified postprocedural states: Secondary | ICD-10-CM | POA: Diagnosis not present

## 2022-03-12 DIAGNOSIS — R278 Other lack of coordination: Secondary | ICD-10-CM | POA: Diagnosis not present

## 2022-03-12 DIAGNOSIS — Z7409 Other reduced mobility: Secondary | ICD-10-CM | POA: Diagnosis not present

## 2022-03-12 DIAGNOSIS — G8191 Hemiplegia, unspecified affecting right dominant side: Secondary | ICD-10-CM | POA: Diagnosis not present

## 2022-03-12 DIAGNOSIS — Z789 Other specified health status: Secondary | ICD-10-CM | POA: Diagnosis not present

## 2022-03-16 DIAGNOSIS — S065XAA Traumatic subdural hemorrhage with loss of consciousness status unknown, initial encounter: Secondary | ICD-10-CM | POA: Diagnosis not present

## 2022-03-23 DIAGNOSIS — Z7409 Other reduced mobility: Secondary | ICD-10-CM | POA: Diagnosis not present

## 2022-03-23 DIAGNOSIS — Z789 Other specified health status: Secondary | ICD-10-CM | POA: Diagnosis not present

## 2022-03-23 DIAGNOSIS — R2681 Unsteadiness on feet: Secondary | ICD-10-CM | POA: Diagnosis not present

## 2022-04-27 DIAGNOSIS — I6203 Nontraumatic chronic subdural hemorrhage: Secondary | ICD-10-CM | POA: Diagnosis not present

## 2022-04-27 DIAGNOSIS — Z9889 Other specified postprocedural states: Secondary | ICD-10-CM | POA: Diagnosis not present

## 2022-06-09 DIAGNOSIS — I6203 Nontraumatic chronic subdural hemorrhage: Secondary | ICD-10-CM | POA: Diagnosis not present

## 2022-06-16 DIAGNOSIS — Z1330 Encounter for screening examination for mental health and behavioral disorders, unspecified: Secondary | ICD-10-CM | POA: Diagnosis not present

## 2022-06-16 DIAGNOSIS — S065XAA Traumatic subdural hemorrhage with loss of consciousness status unknown, initial encounter: Secondary | ICD-10-CM | POA: Diagnosis not present

## 2022-07-23 DIAGNOSIS — I6200 Nontraumatic subdural hemorrhage, unspecified: Secondary | ICD-10-CM | POA: Diagnosis not present

## 2022-07-28 DIAGNOSIS — S065XAA Traumatic subdural hemorrhage with loss of consciousness status unknown, initial encounter: Secondary | ICD-10-CM | POA: Diagnosis not present

## 2022-08-07 DIAGNOSIS — E7800 Pure hypercholesterolemia, unspecified: Secondary | ICD-10-CM | POA: Diagnosis not present

## 2022-08-07 DIAGNOSIS — Z0000 Encounter for general adult medical examination without abnormal findings: Secondary | ICD-10-CM | POA: Diagnosis not present

## 2022-08-07 DIAGNOSIS — Z79899 Other long term (current) drug therapy: Secondary | ICD-10-CM | POA: Diagnosis not present

## 2022-08-07 DIAGNOSIS — I10 Essential (primary) hypertension: Secondary | ICD-10-CM | POA: Diagnosis not present

## 2022-08-07 DIAGNOSIS — Z6824 Body mass index (BMI) 24.0-24.9, adult: Secondary | ICD-10-CM | POA: Diagnosis not present

## 2022-08-07 DIAGNOSIS — M1990 Unspecified osteoarthritis, unspecified site: Secondary | ICD-10-CM | POA: Diagnosis not present

## 2022-08-25 DIAGNOSIS — S065XAA Traumatic subdural hemorrhage with loss of consciousness status unknown, initial encounter: Secondary | ICD-10-CM | POA: Diagnosis not present

## 2022-09-29 DIAGNOSIS — I6203 Nontraumatic chronic subdural hemorrhage: Secondary | ICD-10-CM | POA: Diagnosis not present

## 2022-11-18 DIAGNOSIS — R0981 Nasal congestion: Secondary | ICD-10-CM | POA: Diagnosis not present

## 2022-11-18 DIAGNOSIS — R051 Acute cough: Secondary | ICD-10-CM | POA: Diagnosis not present

## 2022-11-18 DIAGNOSIS — J069 Acute upper respiratory infection, unspecified: Secondary | ICD-10-CM | POA: Diagnosis not present

## 2022-11-24 DIAGNOSIS — I6203 Nontraumatic chronic subdural hemorrhage: Secondary | ICD-10-CM | POA: Diagnosis not present

## 2022-12-01 DIAGNOSIS — I6203 Nontraumatic chronic subdural hemorrhage: Secondary | ICD-10-CM | POA: Diagnosis not present

## 2023-01-05 DIAGNOSIS — Z6823 Body mass index (BMI) 23.0-23.9, adult: Secondary | ICD-10-CM | POA: Diagnosis not present

## 2023-01-05 DIAGNOSIS — E7800 Pure hypercholesterolemia, unspecified: Secondary | ICD-10-CM | POA: Diagnosis not present

## 2023-01-05 DIAGNOSIS — I10 Essential (primary) hypertension: Secondary | ICD-10-CM | POA: Diagnosis not present
